# Patient Record
Sex: Female | Born: 1935 | Race: White | Hispanic: No | Smoking: Never smoker
Health system: Southern US, Community
[De-identification: ages and names within clinical notes are randomized; demographics above are authoritative.]

## PROBLEM LIST (undated history)

## (undated) DIAGNOSIS — I10 Essential (primary) hypertension: Secondary | ICD-10-CM

## (undated) DIAGNOSIS — E119 Type 2 diabetes mellitus without complications: Secondary | ICD-10-CM

## (undated) DIAGNOSIS — F0390 Unspecified dementia without behavioral disturbance: Secondary | ICD-10-CM

## (undated) DIAGNOSIS — Z8572 Personal history of non-Hodgkin lymphomas: Secondary | ICD-10-CM

## (undated) DIAGNOSIS — R569 Unspecified convulsions: Secondary | ICD-10-CM

## (undated) HISTORY — PX: NECK SURGERY: SHX720

---

## 2004-05-28 ENCOUNTER — Inpatient Hospital Stay: Payer: Self-pay | Admitting: Internal Medicine

## 2004-06-11 ENCOUNTER — Ambulatory Visit: Payer: Self-pay | Admitting: Oncology

## 2004-06-17 ENCOUNTER — Ambulatory Visit: Payer: Self-pay | Admitting: Oncology

## 2004-07-02 ENCOUNTER — Ambulatory Visit: Payer: Self-pay | Admitting: Oncology

## 2004-07-06 ENCOUNTER — Ambulatory Visit: Payer: Self-pay | Admitting: Oncology

## 2004-08-10 ENCOUNTER — Ambulatory Visit: Payer: Self-pay | Admitting: Internal Medicine

## 2004-10-05 ENCOUNTER — Ambulatory Visit: Payer: Self-pay | Admitting: Oncology

## 2004-10-28 ENCOUNTER — Ambulatory Visit: Payer: Self-pay | Admitting: Oncology

## 2005-02-08 ENCOUNTER — Ambulatory Visit: Payer: Self-pay | Admitting: Oncology

## 2005-02-27 ENCOUNTER — Ambulatory Visit: Payer: Self-pay | Admitting: Oncology

## 2005-03-30 ENCOUNTER — Ambulatory Visit: Payer: Self-pay | Admitting: Oncology

## 2005-05-26 ENCOUNTER — Ambulatory Visit: Payer: Self-pay | Admitting: Oncology

## 2005-06-08 ENCOUNTER — Ambulatory Visit: Payer: Self-pay | Admitting: Oncology

## 2005-06-24 ENCOUNTER — Ambulatory Visit: Payer: Self-pay | Admitting: Oncology

## 2005-06-30 ENCOUNTER — Ambulatory Visit: Payer: Self-pay | Admitting: Oncology

## 2005-09-13 ENCOUNTER — Ambulatory Visit: Payer: Self-pay | Admitting: Internal Medicine

## 2005-09-15 ENCOUNTER — Ambulatory Visit: Payer: Self-pay | Admitting: Internal Medicine

## 2005-11-02 ENCOUNTER — Ambulatory Visit: Payer: Self-pay | Admitting: Oncology

## 2005-11-27 ENCOUNTER — Ambulatory Visit: Payer: Self-pay | Admitting: Oncology

## 2006-03-10 ENCOUNTER — Ambulatory Visit: Payer: Self-pay | Admitting: Internal Medicine

## 2006-03-15 ENCOUNTER — Ambulatory Visit: Payer: Self-pay | Admitting: Internal Medicine

## 2006-05-01 ENCOUNTER — Ambulatory Visit: Payer: Self-pay | Admitting: Oncology

## 2006-05-30 ENCOUNTER — Ambulatory Visit: Payer: Self-pay | Admitting: Oncology

## 2006-09-28 ENCOUNTER — Ambulatory Visit: Payer: Self-pay | Admitting: Internal Medicine

## 2006-10-29 ENCOUNTER — Ambulatory Visit: Payer: Self-pay | Admitting: Oncology

## 2006-11-01 ENCOUNTER — Ambulatory Visit: Payer: Self-pay | Admitting: Oncology

## 2006-11-28 ENCOUNTER — Ambulatory Visit: Payer: Self-pay | Admitting: Oncology

## 2007-04-12 ENCOUNTER — Ambulatory Visit: Payer: Self-pay | Admitting: Internal Medicine

## 2007-05-02 ENCOUNTER — Ambulatory Visit: Payer: Self-pay | Admitting: Oncology

## 2007-05-31 ENCOUNTER — Ambulatory Visit: Payer: Self-pay | Admitting: Oncology

## 2007-09-13 ENCOUNTER — Emergency Department: Payer: Self-pay | Admitting: Emergency Medicine

## 2007-10-02 ENCOUNTER — Ambulatory Visit: Payer: Self-pay | Admitting: Internal Medicine

## 2007-10-29 ENCOUNTER — Ambulatory Visit: Payer: Self-pay | Admitting: Oncology

## 2007-11-28 ENCOUNTER — Ambulatory Visit: Payer: Self-pay | Admitting: Oncology

## 2008-04-15 ENCOUNTER — Ambulatory Visit: Payer: Self-pay | Admitting: Internal Medicine

## 2008-10-06 ENCOUNTER — Ambulatory Visit: Payer: Self-pay | Admitting: Internal Medicine

## 2008-10-28 ENCOUNTER — Ambulatory Visit: Payer: Self-pay | Admitting: Oncology

## 2008-10-30 ENCOUNTER — Ambulatory Visit: Payer: Self-pay | Admitting: Oncology

## 2008-11-27 ENCOUNTER — Ambulatory Visit: Payer: Self-pay | Admitting: Oncology

## 2009-10-08 ENCOUNTER — Ambulatory Visit: Payer: Self-pay | Admitting: Internal Medicine

## 2009-10-28 ENCOUNTER — Ambulatory Visit: Payer: Self-pay | Admitting: Oncology

## 2009-11-04 ENCOUNTER — Ambulatory Visit: Payer: Self-pay | Admitting: Oncology

## 2009-11-27 ENCOUNTER — Ambulatory Visit: Payer: Self-pay | Admitting: Oncology

## 2010-02-27 ENCOUNTER — Ambulatory Visit: Payer: Self-pay | Admitting: Oncology

## 2010-03-08 ENCOUNTER — Ambulatory Visit: Payer: Self-pay | Admitting: Oncology

## 2010-03-10 LAB — CEA: CEA: 3.3 ng/mL (ref 0.0–4.7)

## 2010-03-30 ENCOUNTER — Ambulatory Visit: Payer: Self-pay | Admitting: Oncology

## 2010-09-27 ENCOUNTER — Ambulatory Visit: Payer: Self-pay | Admitting: Oncology

## 2010-09-28 ENCOUNTER — Ambulatory Visit: Payer: Self-pay | Admitting: Oncology

## 2010-10-12 ENCOUNTER — Ambulatory Visit: Payer: Self-pay | Admitting: Internal Medicine

## 2011-03-28 ENCOUNTER — Ambulatory Visit: Payer: Self-pay | Admitting: Oncology

## 2011-03-31 ENCOUNTER — Ambulatory Visit: Payer: Self-pay | Admitting: Oncology

## 2011-12-12 ENCOUNTER — Ambulatory Visit: Payer: Self-pay | Admitting: Internal Medicine

## 2012-02-20 ENCOUNTER — Ambulatory Visit: Payer: Self-pay | Admitting: Oncology

## 2012-02-20 LAB — COMPREHENSIVE METABOLIC PANEL
Alkaline Phosphatase: 87 U/L (ref 50–136)
Anion Gap: 6 — ABNORMAL LOW (ref 7–16)
BUN: 16 mg/dL (ref 7–18)
Bilirubin,Total: 0.5 mg/dL (ref 0.2–1.0)
Chloride: 104 mmol/L (ref 98–107)
Co2: 31 mmol/L (ref 21–32)
Creatinine: 0.71 mg/dL (ref 0.60–1.30)
EGFR (African American): >60
EGFR (Non-African Amer.): >60
Glucose: 140 mg/dL — ABNORMAL HIGH (ref 65–99)
Osmolality: 285 (ref 275–301)
Potassium: 4.3 mmol/L (ref 3.5–5.1)
SGPT (ALT): 19 U/L (ref 12–78)
Total Protein: 7.5 g/dL (ref 6.4–8.2)

## 2012-02-20 LAB — CBC CANCER CENTER
Basophil #: 0 x10 3/mm (ref 0.0–0.1)
Basophil %: 0.7 %
Eosinophil #: 0.3 x10 3/mm (ref 0.0–0.7)
HCT: 41.9 % (ref 35.0–47.0)
HGB: 13.7 g/dL (ref 12.0–16.0)
Lymphocyte %: 34.4 %
MCH: 30.9 pg (ref 26.0–34.0)
MCHC: 32.7 g/dL (ref 32.0–36.0)
Monocyte #: 0.5 x10 3/mm (ref 0.2–0.9)
Neutrophil #: 2.9 x10 3/mm (ref 1.4–6.5)
Platelet: 175 x10 3/mm (ref 150–440)
RDW: 13.3 % (ref 11.5–14.5)

## 2012-02-20 LAB — LACTATE DEHYDROGENASE: LDH: 135 U/L (ref 81–246)

## 2012-02-28 ENCOUNTER — Ambulatory Visit: Payer: Self-pay | Admitting: Oncology

## 2012-12-12 ENCOUNTER — Ambulatory Visit: Payer: Self-pay | Admitting: Internal Medicine

## 2014-03-12 ENCOUNTER — Ambulatory Visit: Payer: Self-pay | Admitting: Internal Medicine

## 2014-06-18 DIAGNOSIS — L821 Other seborrheic keratosis: Secondary | ICD-10-CM | POA: Diagnosis not present

## 2014-06-18 DIAGNOSIS — L219 Seborrheic dermatitis, unspecified: Secondary | ICD-10-CM | POA: Diagnosis not present

## 2014-06-18 DIAGNOSIS — L814 Other melanin hyperpigmentation: Secondary | ICD-10-CM | POA: Diagnosis not present

## 2014-06-18 DIAGNOSIS — L820 Inflamed seborrheic keratosis: Secondary | ICD-10-CM | POA: Diagnosis not present

## 2014-06-18 DIAGNOSIS — L578 Other skin changes due to chronic exposure to nonionizing radiation: Secondary | ICD-10-CM | POA: Diagnosis not present

## 2014-06-18 DIAGNOSIS — L570 Actinic keratosis: Secondary | ICD-10-CM | POA: Diagnosis not present

## 2014-06-27 DIAGNOSIS — E119 Type 2 diabetes mellitus without complications: Secondary | ICD-10-CM | POA: Diagnosis not present

## 2014-06-27 DIAGNOSIS — I10 Essential (primary) hypertension: Secondary | ICD-10-CM | POA: Diagnosis not present

## 2014-06-27 DIAGNOSIS — F0150 Vascular dementia without behavioral disturbance: Secondary | ICD-10-CM | POA: Diagnosis not present

## 2014-06-27 DIAGNOSIS — R7989 Other specified abnormal findings of blood chemistry: Secondary | ICD-10-CM | POA: Diagnosis not present

## 2014-07-04 DIAGNOSIS — I10 Essential (primary) hypertension: Secondary | ICD-10-CM | POA: Diagnosis not present

## 2014-07-04 DIAGNOSIS — E119 Type 2 diabetes mellitus without complications: Secondary | ICD-10-CM | POA: Diagnosis not present

## 2014-07-04 DIAGNOSIS — R7989 Other specified abnormal findings of blood chemistry: Secondary | ICD-10-CM | POA: Diagnosis not present

## 2014-11-20 DIAGNOSIS — M25562 Pain in left knee: Secondary | ICD-10-CM | POA: Diagnosis not present

## 2014-11-20 DIAGNOSIS — M1712 Unilateral primary osteoarthritis, left knee: Secondary | ICD-10-CM | POA: Diagnosis not present

## 2014-12-03 DIAGNOSIS — E119 Type 2 diabetes mellitus without complications: Secondary | ICD-10-CM | POA: Diagnosis not present

## 2014-12-03 DIAGNOSIS — R7989 Other specified abnormal findings of blood chemistry: Secondary | ICD-10-CM | POA: Diagnosis not present

## 2014-12-03 DIAGNOSIS — I10 Essential (primary) hypertension: Secondary | ICD-10-CM | POA: Diagnosis not present

## 2014-12-10 DIAGNOSIS — I10 Essential (primary) hypertension: Secondary | ICD-10-CM | POA: Diagnosis not present

## 2014-12-10 DIAGNOSIS — R7989 Other specified abnormal findings of blood chemistry: Secondary | ICD-10-CM | POA: Diagnosis not present

## 2014-12-10 DIAGNOSIS — M25562 Pain in left knee: Secondary | ICD-10-CM | POA: Diagnosis not present

## 2014-12-10 DIAGNOSIS — E119 Type 2 diabetes mellitus without complications: Secondary | ICD-10-CM | POA: Diagnosis not present

## 2014-12-12 DIAGNOSIS — M25562 Pain in left knee: Secondary | ICD-10-CM | POA: Diagnosis not present

## 2014-12-12 DIAGNOSIS — M1712 Unilateral primary osteoarthritis, left knee: Secondary | ICD-10-CM | POA: Diagnosis not present

## 2015-04-10 DIAGNOSIS — E119 Type 2 diabetes mellitus without complications: Secondary | ICD-10-CM | POA: Diagnosis not present

## 2015-04-10 DIAGNOSIS — R7989 Other specified abnormal findings of blood chemistry: Secondary | ICD-10-CM | POA: Diagnosis not present

## 2015-04-10 DIAGNOSIS — M25562 Pain in left knee: Secondary | ICD-10-CM | POA: Diagnosis not present

## 2015-04-10 DIAGNOSIS — I10 Essential (primary) hypertension: Secondary | ICD-10-CM | POA: Diagnosis not present

## 2015-04-15 DIAGNOSIS — L84 Corns and callosities: Secondary | ICD-10-CM | POA: Diagnosis not present

## 2015-04-15 DIAGNOSIS — F0150 Vascular dementia without behavioral disturbance: Secondary | ICD-10-CM | POA: Diagnosis not present

## 2015-04-15 DIAGNOSIS — R634 Abnormal weight loss: Secondary | ICD-10-CM | POA: Diagnosis not present

## 2015-04-15 DIAGNOSIS — R140 Abdominal distension (gaseous): Secondary | ICD-10-CM | POA: Diagnosis not present

## 2015-04-15 DIAGNOSIS — Z23 Encounter for immunization: Secondary | ICD-10-CM | POA: Diagnosis not present

## 2015-04-15 DIAGNOSIS — E119 Type 2 diabetes mellitus without complications: Secondary | ICD-10-CM | POA: Diagnosis not present

## 2015-04-15 DIAGNOSIS — I10 Essential (primary) hypertension: Secondary | ICD-10-CM | POA: Diagnosis not present

## 2015-04-15 DIAGNOSIS — R7989 Other specified abnormal findings of blood chemistry: Secondary | ICD-10-CM | POA: Diagnosis not present

## 2015-04-21 ENCOUNTER — Other Ambulatory Visit: Payer: Self-pay | Admitting: Internal Medicine

## 2015-04-21 DIAGNOSIS — R634 Abnormal weight loss: Secondary | ICD-10-CM

## 2015-04-21 DIAGNOSIS — R140 Abdominal distension (gaseous): Secondary | ICD-10-CM

## 2015-05-04 DIAGNOSIS — M898X9 Other specified disorders of bone, unspecified site: Secondary | ICD-10-CM | POA: Diagnosis not present

## 2015-05-04 DIAGNOSIS — B351 Tinea unguium: Secondary | ICD-10-CM | POA: Diagnosis not present

## 2015-05-04 DIAGNOSIS — E1142 Type 2 diabetes mellitus with diabetic polyneuropathy: Secondary | ICD-10-CM | POA: Diagnosis not present

## 2015-05-04 DIAGNOSIS — L851 Acquired keratosis [keratoderma] palmaris et plantaris: Secondary | ICD-10-CM | POA: Diagnosis not present

## 2015-05-07 ENCOUNTER — Ambulatory Visit
Admission: RE | Admit: 2015-05-07 | Discharge: 2015-05-07 | Disposition: A | Discharge status: Home / Self Care | Payer: Commercial Managed Care - HMO | Source: Ambulatory Visit | Attending: Internal Medicine | Admitting: Internal Medicine

## 2015-05-07 DIAGNOSIS — R634 Abnormal weight loss: Secondary | ICD-10-CM | POA: Diagnosis not present

## 2015-05-07 DIAGNOSIS — R140 Abdominal distension (gaseous): Secondary | ICD-10-CM | POA: Insufficient documentation

## 2015-05-07 HISTORY — DX: Type 2 diabetes mellitus without complications: E11.9

## 2015-05-07 MED ORDER — IOHEXOL 300 MG/ML  SOLN
100.0000 mL | Freq: Once | INTRAMUSCULAR | Status: AC | PRN
  Administered 2015-05-07: 100 mL via INTRAVENOUS

## 2015-05-29 DIAGNOSIS — E119 Type 2 diabetes mellitus without complications: Secondary | ICD-10-CM | POA: Diagnosis not present

## 2015-08-14 ENCOUNTER — Other Ambulatory Visit: Payer: Self-pay | Admitting: Internal Medicine

## 2015-08-14 DIAGNOSIS — Z8572 Personal history of non-Hodgkin lymphomas: Secondary | ICD-10-CM | POA: Diagnosis not present

## 2015-08-14 DIAGNOSIS — L989 Disorder of the skin and subcutaneous tissue, unspecified: Secondary | ICD-10-CM | POA: Diagnosis not present

## 2015-08-14 DIAGNOSIS — F0150 Vascular dementia without behavioral disturbance: Secondary | ICD-10-CM | POA: Diagnosis not present

## 2015-08-14 DIAGNOSIS — I10 Essential (primary) hypertension: Secondary | ICD-10-CM | POA: Diagnosis not present

## 2015-08-14 DIAGNOSIS — Z1231 Encounter for screening mammogram for malignant neoplasm of breast: Secondary | ICD-10-CM

## 2015-08-14 DIAGNOSIS — E119 Type 2 diabetes mellitus without complications: Secondary | ICD-10-CM | POA: Diagnosis not present

## 2015-08-14 DIAGNOSIS — Z1239 Encounter for other screening for malignant neoplasm of breast: Secondary | ICD-10-CM | POA: Diagnosis not present

## 2015-08-24 ENCOUNTER — Ambulatory Visit: Payer: Commercial Managed Care - HMO

## 2015-12-23 DIAGNOSIS — Z1239 Encounter for other screening for malignant neoplasm of breast: Secondary | ICD-10-CM | POA: Diagnosis not present

## 2015-12-23 DIAGNOSIS — Z8572 Personal history of non-Hodgkin lymphomas: Secondary | ICD-10-CM | POA: Diagnosis not present

## 2015-12-23 DIAGNOSIS — I10 Essential (primary) hypertension: Secondary | ICD-10-CM | POA: Diagnosis not present

## 2015-12-23 DIAGNOSIS — E119 Type 2 diabetes mellitus without complications: Secondary | ICD-10-CM | POA: Diagnosis not present

## 2015-12-23 DIAGNOSIS — F0150 Vascular dementia without behavioral disturbance: Secondary | ICD-10-CM | POA: Diagnosis not present

## 2015-12-30 DIAGNOSIS — E119 Type 2 diabetes mellitus without complications: Secondary | ICD-10-CM | POA: Diagnosis not present

## 2015-12-30 DIAGNOSIS — R7989 Other specified abnormal findings of blood chemistry: Secondary | ICD-10-CM | POA: Diagnosis not present

## 2015-12-30 DIAGNOSIS — Z0000 Encounter for general adult medical examination without abnormal findings: Secondary | ICD-10-CM | POA: Diagnosis not present

## 2015-12-30 DIAGNOSIS — F0150 Vascular dementia without behavioral disturbance: Secondary | ICD-10-CM | POA: Diagnosis not present

## 2015-12-30 DIAGNOSIS — I10 Essential (primary) hypertension: Secondary | ICD-10-CM | POA: Diagnosis not present

## 2015-12-30 DIAGNOSIS — Z23 Encounter for immunization: Secondary | ICD-10-CM | POA: Diagnosis not present

## 2015-12-31 DIAGNOSIS — R7989 Other specified abnormal findings of blood chemistry: Secondary | ICD-10-CM | POA: Diagnosis not present

## 2015-12-31 DIAGNOSIS — Z23 Encounter for immunization: Secondary | ICD-10-CM | POA: Diagnosis not present

## 2015-12-31 DIAGNOSIS — E119 Type 2 diabetes mellitus without complications: Secondary | ICD-10-CM | POA: Diagnosis not present

## 2015-12-31 DIAGNOSIS — F0150 Vascular dementia without behavioral disturbance: Secondary | ICD-10-CM | POA: Diagnosis not present

## 2015-12-31 DIAGNOSIS — Z0000 Encounter for general adult medical examination without abnormal findings: Secondary | ICD-10-CM | POA: Diagnosis not present

## 2015-12-31 DIAGNOSIS — I10 Essential (primary) hypertension: Secondary | ICD-10-CM | POA: Diagnosis not present

## 2016-03-15 DIAGNOSIS — E119 Type 2 diabetes mellitus without complications: Secondary | ICD-10-CM | POA: Diagnosis not present

## 2016-03-15 DIAGNOSIS — F0150 Vascular dementia without behavioral disturbance: Secondary | ICD-10-CM | POA: Diagnosis not present

## 2016-03-15 DIAGNOSIS — R8290 Unspecified abnormal findings in urine: Secondary | ICD-10-CM | POA: Diagnosis not present

## 2016-03-15 DIAGNOSIS — R062 Wheezing: Secondary | ICD-10-CM | POA: Diagnosis not present

## 2016-03-15 DIAGNOSIS — Z0000 Encounter for general adult medical examination without abnormal findings: Secondary | ICD-10-CM | POA: Diagnosis not present

## 2016-03-15 DIAGNOSIS — J209 Acute bronchitis, unspecified: Secondary | ICD-10-CM | POA: Diagnosis not present

## 2016-03-15 DIAGNOSIS — R05 Cough: Secondary | ICD-10-CM | POA: Diagnosis not present

## 2016-03-15 DIAGNOSIS — I10 Essential (primary) hypertension: Secondary | ICD-10-CM | POA: Diagnosis not present

## 2016-04-29 DIAGNOSIS — F0150 Vascular dementia without behavioral disturbance: Secondary | ICD-10-CM | POA: Diagnosis not present

## 2016-04-29 DIAGNOSIS — E119 Type 2 diabetes mellitus without complications: Secondary | ICD-10-CM | POA: Diagnosis not present

## 2016-04-29 DIAGNOSIS — Z23 Encounter for immunization: Secondary | ICD-10-CM | POA: Diagnosis not present

## 2016-04-29 DIAGNOSIS — Z0000 Encounter for general adult medical examination without abnormal findings: Secondary | ICD-10-CM | POA: Diagnosis not present

## 2016-04-29 DIAGNOSIS — I10 Essential (primary) hypertension: Secondary | ICD-10-CM | POA: Diagnosis not present

## 2016-04-29 DIAGNOSIS — R7989 Other specified abnormal findings of blood chemistry: Secondary | ICD-10-CM | POA: Diagnosis not present

## 2016-05-05 DIAGNOSIS — I10 Essential (primary) hypertension: Secondary | ICD-10-CM | POA: Diagnosis not present

## 2016-05-05 DIAGNOSIS — E119 Type 2 diabetes mellitus without complications: Secondary | ICD-10-CM | POA: Diagnosis not present

## 2016-05-05 DIAGNOSIS — F0150 Vascular dementia without behavioral disturbance: Secondary | ICD-10-CM | POA: Diagnosis not present

## 2016-05-06 ENCOUNTER — Other Ambulatory Visit: Payer: Self-pay | Admitting: Internal Medicine

## 2016-05-06 DIAGNOSIS — F0150 Vascular dementia without behavioral disturbance: Secondary | ICD-10-CM

## 2016-05-15 ENCOUNTER — Inpatient Hospital Stay
Admission: EM | Admit: 2016-05-15 | Discharge: 2016-05-17 | DRG: 066 | Payer: Commercial Managed Care - HMO | Attending: Internal Medicine | Admitting: Internal Medicine

## 2016-05-15 ENCOUNTER — Encounter: Payer: Self-pay | Admitting: Emergency Medicine

## 2016-05-15 ENCOUNTER — Emergency Department: Payer: Commercial Managed Care - HMO

## 2016-05-15 DIAGNOSIS — R4701 Aphasia: Secondary | ICD-10-CM | POA: Diagnosis not present

## 2016-05-15 DIAGNOSIS — E1151 Type 2 diabetes mellitus with diabetic peripheral angiopathy without gangrene: Secondary | ICD-10-CM | POA: Diagnosis present

## 2016-05-15 DIAGNOSIS — Z79899 Other long term (current) drug therapy: Secondary | ICD-10-CM | POA: Diagnosis not present

## 2016-05-15 DIAGNOSIS — I69320 Aphasia following cerebral infarction: Secondary | ICD-10-CM | POA: Diagnosis not present

## 2016-05-15 DIAGNOSIS — F0390 Unspecified dementia without behavioral disturbance: Secondary | ICD-10-CM | POA: Diagnosis present

## 2016-05-15 DIAGNOSIS — M6281 Muscle weakness (generalized): Secondary | ICD-10-CM | POA: Diagnosis not present

## 2016-05-15 DIAGNOSIS — Z8572 Personal history of non-Hodgkin lymphomas: Secondary | ICD-10-CM | POA: Diagnosis not present

## 2016-05-15 DIAGNOSIS — R4182 Altered mental status, unspecified: Secondary | ICD-10-CM | POA: Diagnosis not present

## 2016-05-15 DIAGNOSIS — Z66 Do not resuscitate: Secondary | ICD-10-CM | POA: Diagnosis not present

## 2016-05-15 DIAGNOSIS — Z833 Family history of diabetes mellitus: Secondary | ICD-10-CM | POA: Diagnosis not present

## 2016-05-15 DIAGNOSIS — E119 Type 2 diabetes mellitus without complications: Secondary | ICD-10-CM | POA: Diagnosis not present

## 2016-05-15 DIAGNOSIS — Z7984 Long term (current) use of oral hypoglycemic drugs: Secondary | ICD-10-CM | POA: Diagnosis not present

## 2016-05-15 DIAGNOSIS — R2689 Other abnormalities of gait and mobility: Secondary | ICD-10-CM | POA: Diagnosis not present

## 2016-05-15 DIAGNOSIS — E876 Hypokalemia: Secondary | ICD-10-CM | POA: Diagnosis present

## 2016-05-15 DIAGNOSIS — R29716 NIHSS score 16: Secondary | ICD-10-CM | POA: Diagnosis present

## 2016-05-15 DIAGNOSIS — R488 Other symbolic dysfunctions: Secondary | ICD-10-CM | POA: Diagnosis not present

## 2016-05-15 DIAGNOSIS — I10 Essential (primary) hypertension: Secondary | ICD-10-CM | POA: Diagnosis present

## 2016-05-15 DIAGNOSIS — Z888 Allergy status to other drugs, medicaments and biological substances status: Secondary | ICD-10-CM | POA: Diagnosis not present

## 2016-05-15 DIAGNOSIS — I639 Cerebral infarction, unspecified: Secondary | ICD-10-CM | POA: Diagnosis present

## 2016-05-15 DIAGNOSIS — I638 Other cerebral infarction: Principal | ICD-10-CM | POA: Diagnosis present

## 2016-05-15 DIAGNOSIS — R1312 Dysphagia, oropharyngeal phase: Secondary | ICD-10-CM | POA: Diagnosis not present

## 2016-05-15 DIAGNOSIS — R410 Disorientation, unspecified: Secondary | ICD-10-CM | POA: Diagnosis not present

## 2016-05-15 DIAGNOSIS — I63031 Cerebral infarction due to thrombosis of right carotid artery: Secondary | ICD-10-CM | POA: Diagnosis not present

## 2016-05-15 DIAGNOSIS — G459 Transient cerebral ischemic attack, unspecified: Secondary | ICD-10-CM | POA: Diagnosis not present

## 2016-05-15 DIAGNOSIS — F329 Major depressive disorder, single episode, unspecified: Secondary | ICD-10-CM | POA: Diagnosis not present

## 2016-05-15 DIAGNOSIS — I6381 Other cerebral infarction due to occlusion or stenosis of small artery: Secondary | ICD-10-CM

## 2016-05-15 HISTORY — DX: Personal history of non-Hodgkin lymphomas: Z85.72

## 2016-05-15 HISTORY — DX: Unspecified dementia, unspecified severity, without behavioral disturbance, psychotic disturbance, mood disturbance, and anxiety: F03.90

## 2016-05-15 HISTORY — DX: Essential (primary) hypertension: I10

## 2016-05-15 LAB — CBC WITH DIFFERENTIAL/PLATELET
BASOS ABS: 0.1 10*3/uL (ref 0–0.1)
BASOS PCT: 1 %
EOS PCT: 5 %
Eosinophils Absolute: 0.6 10*3/uL (ref 0–0.7)
HCT: 45.7 % (ref 35.0–47.0)
Hemoglobin: 15.3 g/dL (ref 12.0–16.0)
LYMPHS PCT: 30 %
Lymphs Abs: 3.4 10*3/uL (ref 1.0–3.6)
MCH: 30.8 pg (ref 26.0–34.0)
MCHC: 33.5 g/dL (ref 32.0–36.0)
MCV: 92.1 fL (ref 80.0–100.0)
MONO ABS: 0.8 10*3/uL (ref 0.2–0.9)
Monocytes Relative: 7 %
NEUTROS ABS: 6.7 10*3/uL — AB (ref 1.4–6.5)
Neutrophils Relative %: 57 %
PLATELETS: 272 10*3/uL (ref 150–440)
RBC: 4.96 MIL/uL (ref 3.80–5.20)
RDW: 13.8 % (ref 11.5–14.5)
WBC: 11.5 10*3/uL — AB (ref 3.6–11.0)

## 2016-05-15 LAB — URINALYSIS, ROUTINE W REFLEX MICROSCOPIC
Bilirubin Urine: NEGATIVE
Glucose, UA: 50 mg/dL — AB
Hgb urine dipstick: NEGATIVE
Ketones, ur: NEGATIVE mg/dL
LEUKOCYTES UA: NEGATIVE
NITRITE: NEGATIVE
PROTEIN: NEGATIVE mg/dL
Specific Gravity, Urine: 1.006 (ref 1.005–1.030)
pH: 8 (ref 5.0–8.0)

## 2016-05-15 LAB — COMPREHENSIVE METABOLIC PANEL
ALBUMIN: 4.5 g/dL (ref 3.5–5.0)
ALT: 19 U/L (ref 14–54)
AST: 28 U/L (ref 15–41)
Alkaline Phosphatase: 85 U/L (ref 38–126)
Anion gap: 10 (ref 5–15)
BUN: 17 mg/dL (ref 6–20)
CHLORIDE: 100 mmol/L — AB (ref 101–111)
CO2: 28 mmol/L (ref 22–32)
CREATININE: 0.66 mg/dL (ref 0.44–1.00)
Calcium: 10 mg/dL (ref 8.9–10.3)
GFR calc Af Amer: >60 mL/min (ref >60)
GFR calc non Af Amer: >60 mL/min (ref >60)
GLUCOSE: 175 mg/dL — AB (ref 65–99)
POTASSIUM: 3.1 mmol/L — AB (ref 3.5–5.1)
Sodium: 138 mmol/L (ref 135–145)
Total Bilirubin: 0.5 mg/dL (ref 0.3–1.2)
Total Protein: 8.6 g/dL — ABNORMAL HIGH (ref 6.5–8.1)

## 2016-05-15 LAB — URINE DRUG SCREEN, QUALITATIVE (ARMC ONLY)
Amphetamines, Ur Screen: NOT DETECTED
BARBITURATES, UR SCREEN: NOT DETECTED
BENZODIAZEPINE, UR SCRN: NOT DETECTED
Cannabinoid 50 Ng, Ur ~~LOC~~: NOT DETECTED
Cocaine Metabolite,Ur ~~LOC~~: NOT DETECTED
MDMA (Ecstasy)Ur Screen: NOT DETECTED
METHADONE SCREEN, URINE: NOT DETECTED
OPIATE, UR SCREEN: NOT DETECTED
Phencyclidine (PCP) Ur S: NOT DETECTED
TRICYCLIC, UR SCREEN: NOT DETECTED

## 2016-05-15 LAB — MAGNESIUM: Magnesium: 1.6 mg/dL — ABNORMAL LOW (ref 1.7–2.4)

## 2016-05-15 LAB — TROPONIN I
Troponin I: <0.03 ng/mL (ref <0.03)
Troponin I: <0.03 ng/mL (ref <0.03)

## 2016-05-15 LAB — LIPASE, BLOOD: Lipase: 18 U/L (ref 11–51)

## 2016-05-15 LAB — PROTIME-INR
INR: 0.95
PROTHROMBIN TIME: 12.7 s (ref 11.4–15.2)

## 2016-05-15 LAB — GLUCOSE, CAPILLARY
GLUCOSE-CAPILLARY: 168 mg/dL — AB (ref 65–99)
Glucose-Capillary: 190 mg/dL — ABNORMAL HIGH (ref 65–99)

## 2016-05-15 LAB — ETHANOL: Alcohol, Ethyl (B): <5 mg/dL (ref <5)

## 2016-05-15 MED ORDER — SODIUM CHLORIDE 0.9 % IV SOLN
INTRAVENOUS | Status: DC
  Administered 2016-05-15: 23:00:00 via INTRAVENOUS

## 2016-05-15 MED ORDER — INSULIN ASPART 100 UNIT/ML ~~LOC~~ SOLN: 0.0000 [IU] | SUBCUTANEOUS | Status: DC

## 2016-05-15 MED ORDER — ENOXAPARIN SODIUM 40 MG/0.4ML ~~LOC~~ SOLN
40.0000 mg | SUBCUTANEOUS | Status: DC
  Administered 2016-05-15 – 2016-05-16 (×2): 40 mg via SUBCUTANEOUS
  Filled 2016-05-15 (×2): qty 0.4

## 2016-05-15 MED ORDER — ACETAMINOPHEN 160 MG/5ML PO SOLN: 650.0000 mg | ORAL | Status: DC | PRN

## 2016-05-15 MED ORDER — ACETAMINOPHEN 325 MG PO TABS: 650.0000 mg | ORAL_TABLET | ORAL | Status: DC | PRN

## 2016-05-15 MED ORDER — SENNOSIDES-DOCUSATE SODIUM 8.6-50 MG PO TABS: 1.0000 | ORAL_TABLET | Freq: Every evening | ORAL | Status: DC | PRN

## 2016-05-15 MED ORDER — ASPIRIN 300 MG RE SUPP: 300.0000 mg | Freq: Every day | RECTAL | Status: DC

## 2016-05-15 MED ORDER — STROKE: EARLY STAGES OF RECOVERY BOOK
Freq: Once | Status: AC
  Administered 2016-05-15: 22:00:00

## 2016-05-15 MED ORDER — INSULIN ASPART 100 UNIT/ML ~~LOC~~ SOLN
0.0000 [IU] | SUBCUTANEOUS | Status: DC
  Administered 2016-05-15 – 2016-05-16 (×2): 3 [IU] via SUBCUTANEOUS
  Administered 2016-05-16 (×2): 2 [IU] via SUBCUTANEOUS
  Administered 2016-05-16: 5 [IU] via SUBCUTANEOUS
  Administered 2016-05-16: 18:00:00 3 [IU] via SUBCUTANEOUS
  Filled 2016-05-15: qty 2
  Filled 2016-05-15: qty 5
  Filled 2016-05-15: qty 3
  Filled 2016-05-15: qty 2
  Filled 2016-05-15 (×2): qty 3

## 2016-05-15 MED ORDER — ACETAMINOPHEN 650 MG RE SUPP: 650.0000 mg | RECTAL | Status: DC | PRN

## 2016-05-15 MED ORDER — ASPIRIN 325 MG PO TABS
325.0000 mg | ORAL_TABLET | Freq: Every day | ORAL | Status: DC
  Administered 2016-05-16: 09:00:00 325 mg via ORAL
  Filled 2016-05-15: qty 1

## 2016-05-15 NOTE — Progress Notes (Signed)
Family Meeting Note  Advance Directive:yes  Today a meeting took place with the Daugher / HCP. Patient is unable to participate due WG:NFAOZHto:Lacked capacity AMS   The following clinical team members were present during this meeting:MD  The following were discussed:Patient's diagnosis: , Patient's progosis: Unable to determine and Goals for treatment: Full Code - patient's daughter, healthcare proxy indicated that she would like to be DO NOT RESUSCITATE but she does not have paperwork with her. They would consider filling out a MOLST.  Additional follow-up to be provided: Consider palliative care -   Time spent during discussion:15 minutes  [employer] Amiel Mccaffrey, DO

## 2016-05-15 NOTE — ED Notes (Signed)
Pt. Transported to CT 

## 2016-05-15 NOTE — ED Provider Notes (Signed)
[facility] Emergency Department Provider Note  ____________________________________________   First MD Initiated Contact with Patient 05/15/16 [phone]     (approximate)  I have reviewed the triage vital signs and the nursing notes.   HISTORY  Chief Complaint Altered Mental Status  History is limited by the patient's acute altered mental status and very mild chronic dementia  HPI Rose Jones is a 82 y.o. female who is brought by family for evaluation of acute altered mental status.  They state that they first noted she was not acting like herself 2 days ago.  She had "an off day" 2 days ago where she was not sticking to her usual routine and just wanting to sleep.  She seemed generally weak and unable to get around as well as usual.  She was also very quiet yesterday and not interested in her usual activities and was not as communicative as usual.  She would eat and drink but only if told specifically what to do and how to do it; they stated that she would hold a glass of water in her hand and just stare at it until she was told to drink it and then she would do so.  Her status has gradually gotten worse until today when she is unable to ambulate without assistance and is altered and off of her baseline.  She will open her eyes to her name and occasionally smile and will nod or shake her head but is not talking more than a word or 2 at a time and does not seem to be aware of where she is or what is happening.  She will nod and shake her head to answer simple yes and no questions and denies having any chest pain, shortness of breath, nor abdominal pain.  The patient does have mild age-related dementia but typically is very independent and has issues mostly related to short-term memory loss.  Her daughter and daughter-in-law state that her current state is very far from her baseline and very concerning for them.   Past Medical History:  Diagnosis Date  . Dementia   .  Diabetes mellitus without complication (HCC)   . History of B-cell lymphoma   . Hypertension     Patient Active Problem List   Diagnosis Date Noted  . CVA (cerebral vascular accident) (HCC) 05/15/2016    History reviewed. No pertinent surgical history.  Prior to Admission medications   Medication Sig Start Date End Date Taking? Authorizing Provider  amLODipine (NORVASC) 5 MG tablet Take 1 tablet by mouth daily. 03/16/16  Yes Historical Provider, MD  Calcium Carbonate-Vitamin D (CALTRATE 600+D PO) Take 1 tablet by mouth daily. 03/16/16  Yes Historical Provider, MD  glipiZIDE (GLUCOTROL) 10 MG tablet Take 1 tablet by mouth daily. 5mg  at night and 10mg  in the morning 05/06/16  Yes Historical Provider, MD  metFORMIN (GLUCOPHAGE) 500 MG tablet Take 1 tablet by mouth 2 (two) times daily. 03/16/16  Yes Historical Provider, MD  sertraline (ZOLOFT) 25 MG tablet Take 1 tablet by mouth daily. 03/16/16  Yes Historical Provider, MD  vitamin B-12 (CYANOCOBALAMIN) 1000 MCG tablet Take 1 tablet by mouth daily.   Yes Historical Provider, MD  Vitamin D, Ergocalciferol, (DRISDOL) 50000 units CAPS capsule Take 1 capsule by mouth once a week. 04/15/15  Yes Historical Provider, MD    Allergies Estrogens  Family History  Problem Relation Age of Onset  . Diabetes Father     Died at 67  . Diabetes Sister  Social History Social History  Substance Use Topics  . Smoking status: Never Smoker  . Smokeless tobacco: Never Used  . Alcohol use No    Review of Systems  Level 5 caveat: Unable to obtain due to the patient's acute altered mental status  ____________________________________________   PHYSICAL EXAM:  VITAL SIGNS: ED Triage Vitals [05/15/16 1543]  Enc Vitals Group     BP (!) 146/67     Pulse Rate 70     Resp 18     Temp 98 F (36.7 C)     Temp Source Oral     SpO2 95 %     Weight 144 lb (65.3 kg)     Height 5\' 2"  (1.575 m)     Head Circumference      Peak Flow      Pain  Score      Pain Loc      Pain Edu?      Excl. in GC?     Constitutional: Somnolent but opens eyes to light voice.  No acute distress.  Altered/encephalopathic Eyes: Conjunctivae are normal. PERRL. EOMI. Head: Atraumatic. Nose: No congestion/rhinnorhea. Mouth/Throat: Mucous membranes are moist.  Oropharynx non-erythematous. Neck: No stridor.  No meningeal signs.  No cervical spine tenderness to palpation. Cardiovascular: Normal rate, regular rhythm. Good peripheral circulation. Grossly normal heart sounds. Respiratory: Normal respiratory effort.  No retractions. Lungs CTAB. Gastrointestinal: Soft and nontender. No distention.  Musculoskeletal: No lower extremity tenderness nor edema. No gross deformities of extremities. Neurologic:  The patient cannot dissipate and a neurological exam but she is moving her extremities.  He has decreased grip strength bilaterally, slightly less on the right, but this may be due to effort. Skin:  Skin is warm, dry and intact. No rash noted.  ____________________________________________   LABS (all labs ordered are listed, but only abnormal results are displayed)  Labs Reviewed  CBC WITH DIFFERENTIAL/PLATELET - Abnormal; Notable for the following:       Result Value   WBC 11.5 (*)    Neutro Abs 6.7 (*)    All other components within normal limits  COMPREHENSIVE METABOLIC PANEL - Abnormal; Notable for the following:    Potassium 3.1 (*)    Chloride 100 (*)    Glucose, Bld 175 (*)    Total Protein 8.6 (*)    All other components within normal limits  MAGNESIUM - Abnormal; Notable for the following:    Magnesium 1.6 (*)    All other components within normal limits  URINALYSIS, ROUTINE W REFLEX MICROSCOPIC - Abnormal; Notable for the following:    Color, Urine STRAW (*)    APPearance CLEAR (*)    Glucose, UA 50 (*)    All other components within normal limits  LIPASE, BLOOD  TROPONIN I  ETHANOL  PROTIME-INR  URINE DRUG SCREEN, QUALITATIVE  ([facility] ONLY)  GLUCOSE, CAPILLARY   ____________________________________________  EKG  ED ECG REPORT I, Tiajuana Leppanen, the attending physician, personally viewed and interpreted this ECG.  Date: 05/15/2016 EKG Time: 16:21 Rate: 65 Rhythm: normal sinus rhythm QRS Axis: normal Intervals: normal ST/T Wave abnormalities: normal Conduction Disturbances: none Narrative Interpretation: unremarkable  ____________________________________________  RADIOLOGY   Ct Head Wo Contrast  Result Date: 05/15/2016 CLINICAL DATA:  Per family pt woke up this am not acting right. Pt normally very active around the house, today pt had confusion and is not her normal self. Pt with hx of seizures 30+ years ago and hx of B-cell ca. EXAM: CT  HEAD WITHOUT CONTRAST TECHNIQUE: Contiguous axial images were obtained from the base of the skull through the vertex without intravenous contrast. COMPARISON:  MRI of the brain on 03/10/2006 FINDINGS: Brain: There is focal low-attenuation within the left caudate nucleus, internal capsule, globus pallidus, and putamen, consistent with probable subacute or chronic infarct. No associated hemorrhage. The findings are new since the prior MRI. There is moderate atrophy. Significant periventricular white matter changes are consistent with small vessel disease. There is no intra or extra-axial fluid collection or mass lesion. The basilar cisterns and ventricles have a normal appearance. There is no CT evidence for acute infarction or hemorrhage. Vascular: There is atherosclerotic calcification of the carotid siphons. Skull: Normal. Negative for fracture or focal lesion. Sinuses/Orbits: There is moderate mucoperiosteal thickening involving the ethmoid and sphenoid air cells. Orbits are unremarkable in appearance. Mastoid air cells are normally aerated. Other: None IMPRESSION: 1. Interval infarct of the left basal ganglia not associated with hemorrhage. The appearance favors a subacute or  chronic process. 2. Atrophy and small vessel disease. 3. Moderate paranasal sinus disease. Critical Value/emergent results were called by telephone at the time of interpretation on 05/15/2016 at 5:49 pm to Dr. Roxan Hockeyobinson, who verbally acknowledged these results. Electronically Signed   By: Norva PavlovElizabeth  Brown M.D.   On: 05/15/2016 17:50    ____________________________________________   PROCEDURES  Procedure(s) performed:   .Critical Care Performed by: Loleta RoseFORBACH, Yomira Flitton Authorized by: Loleta RoseFORBACH, Jennamarie Goings   Critical care provider statement:    Critical care time (minutes):  30   Critical care time was exclusive of:  Separately billable procedures and treating other patients   Critical care was necessary to treat or prevent imminent or life-threatening deterioration of the following conditions:  CNS failure or compromise   Critical care was time spent personally by me on the following activities:  Development of treatment plan with patient or surrogate, discussions with consultants, evaluation of patient's response to treatment, examination of patient, obtaining history from patient or surrogate, ordering and performing treatments and interventions, ordering and review of laboratory studies, ordering and review of radiographic studies, pulse oximetry, re-evaluation of patient's condition and review of old charts      Critical Care performed: Yes ____________________________________________   INITIAL IMPRESSION / ASSESSMENT AND PLAN / ED COURSE  Pertinent labs & imaging results that were available during my care of the patient were reviewed by me and considered in my medical decision making (see chart for details).  Patient appears acutely encephalopathic but is unclear why.  CVA is my most likely diagnosis although she has no specific or logical deficits other than her altered mental status and possible aphasia.  Broad evaluation including CT head non-contrast, I&O cath for urine, etc.  Discussed plan  with family.   Clinical Course as of May 16 2135  Wynelle LinkSun May 15, 2016  1805 CT scan consistent with subacute infarction, likely occurring 2 days ago when she first was acting abnormal.  She is well outside the TPA window.  I will consult the hospitalist for admission.  I discussed with the patient and her daughter and daughter-in-law the results.  The patient continues to have expressive aphasia but is nodding appropriately and seems to understand what I am saying.  [CF]    Clinical Course User Index [CF] Loleta Roseory Saanvi Hakala, MD   NIH Stroke Scale  Interval: Baseline Time: 16:10 Person Administering Scale: Woodley Petzold  Administer stroke scale items in the order listed. Record performance in each category after each subscale exam.  Do not go back and change scores. Follow directions provided for each exam technique. Scores should reflect what the patient does, not what the clinician thinks the patient can do. The clinician should record answers while administering the exam and work quickly. Except where indicated, the patient should not be coached (i.e., repeated requests to patient to make a special effort).   1a  Level of consciousness: 1=not alert but arousable by minor stimulation to obey, answer or respond  1b. LOC questions:  2=Performs neither task correctly  1c. LOC commands: 2=Performs neither task correctly  2.  Best Gaze: 0=normal  3.  Visual: 0=No visual loss  4. Facial Palsy: 0=Normal symmetric movement  5a.  Motor left arm: 2=Some effort against gravity, limb cannot get to or maintain (if cured) 90 (or 45) degrees, drifts down to bed, but has some effort against gravity  5b.  Motor right arm: 2=Some effort against gravity, limb cannot get to or maintain (if cured) 90 (or 45) degrees, drifts down to bed, but has some effort against gravity  6a. motor left leg: 2=Some effort against gravity, limb cannot get to or maintain (if cured) 90 (or 45) degrees, drifts down to bed, but has some  effort against gravity  6b  Motor right leg:  2=Some effort against gravity, limb cannot get to or maintain (if cured) 90 (or 45) degrees, drifts down to bed, but has some effort against gravity  7. Limb Ataxia: 0=Absent  8.  Sensory: 0=Normal; no sensory loss  9. Best Language:  3=Mute, global aphasia; no usable speech or auditory comprehension  10. Dysarthria: 0=Normal  11. Extinction and Inattention: 0=No abnormality  12. Distal motor function: 0=Normal   Total:   16   Although the patient's NIHSS is 16, it is largely due to her inability to cooperate with the exam.  The substantial aphasia is the most concerning symptom.  She is NOT a candidate for tPA given the onset of symptoms two days ago.  Holding on ASA because patient has not yet had RN swallow screen - deferring to hospitalist. ____________________________________________  FINAL CLINICAL IMPRESSION(S) / ED DIAGNOSES  Final diagnoses:  Basal ganglia infarction (HCC)  Expressive aphasia     MEDICATIONS GIVEN DURING THIS VISIT:  Medications - No data to display   NEW OUTPATIENT MEDICATIONS STARTED DURING THIS VISIT:  Current Discharge Medication List      Current Discharge Medication List      Current Discharge Medication List       Note:  This document was prepared using Dragon voice recognition software and may include unintentional dictation errors.    Loleta Rose, MD 05/15/16 2136

## 2016-05-15 NOTE — ED Triage Notes (Signed)
Woke up Friday morning per family and just wasn't acting herself.  Friday night got worse and has not really been talking since Friday evening.  Has always had some urinary incontinence but now having incontinence of stool as well and not taking care of self. Appears may have mild left facial droop but difficult to assess because pt not smiling much in triage. Pt asked where she is and states "in the middle of no where I guess".  Showed pen and pt states "yes" when asked if she knows what it is but cannot name it.

## 2016-05-15 NOTE — H&P (Signed)
[facility] - [place] @ [facility] Admission History and Physical [employer], D.O.    Patient Name: Rose Jones MR#: [id number] Date of Birth: [birth year] Date of Admission: 05/15/2016  Referring MD/NP/PA: Dr. York Cerise Primary Care Physician: Barbette Reichmann, MD Outpatient Specialists: none  Patient coming from: Home  Chief Complaint: Altered mental status  Please note the entire history is obtained from the patient's family and the emergency department staff and documentation. Patient's history is unreliable.  HPI: Rose Jones is a 82 y.o. female with a known history of diabetes, hypertension, dementia, B-cell lymphoma presents to the emergency department for evaluation of status.  Patient was in a usual state of health until 2 days ago when she experienced a sudden onset of mental status change. Patient's family stated that she was less interactive, very tired and not acting like herself after a long day out with family. Last time she was seen in her usual state of health was 12:15 around 7 PM. Since then she has been less communicative, not interested in her regular activities and sleeping more than usual. Patient's daughter and daughter-in-law indicates that she will occasionally respond to them with single word answers or shakes her head yes and no but most of the time she just looks at them with a blank stare. Today she was unable to ambulate without assistance which is far from her baseline where she is functionally independent.  Patient's daughter indicated that she has a brain MRI scheduled through her primary care provider because of a worsening of her dementia over the past 3 months.   Otherwise there has been no change in status. Patient has been taking medication as prescribed and there has been no recent change in medication or diet.  There has been no recent illness, travel or sick contacts.     Review of Systems:  Unable to obtain secondary to patient's altered  mental status   Past Medical History:  Diagnosis Date  . Dementia   . Diabetes mellitus without complication (HCC)   . History of B-cell lymphoma   . Hypertension     Surgical history significant for lymph node excision in the right neck. Family denies alcohol, tobacco or drug use. Patient lives with her 3 children rotating between their homes each month.  Allergies  Allergen Reactions  . Estrogens Other (See Comments)    Unknown     Family History  Problem Relation Age of Onset  . Diabetes Father     Died at 56  . Diabetes Sister    Family history has been reviewed and confirmed with patient.   Prior to Admission medications   Medication Sig Start Date End Date Taking? Authorizing Provider  amLODipine (NORVASC) 5 MG tablet Take 1 tablet by mouth daily. 03/16/16  Yes Historical Provider, MD  Calcium Carbonate-Vitamin D (CALTRATE 600+D PO) Take 1 tablet by mouth daily. 03/16/16  Yes Historical Provider, MD  glipiZIDE (GLUCOTROL) 10 MG tablet Take 1 tablet by mouth daily. 5mg  at night and 10mg  in the morning 05/06/16  Yes Historical Provider, MD  metFORMIN (GLUCOPHAGE) 500 MG tablet Take 1 tablet by mouth 2 (two) times daily. 03/16/16  Yes Historical Provider, MD  sertraline (ZOLOFT) 25 MG tablet Take 1 tablet by mouth daily. 03/16/16  Yes Historical Provider, MD  vitamin B-12 (CYANOCOBALAMIN) 1000 MCG tablet Take 1 tablet by mouth daily.   Yes Historical Provider, MD  Vitamin D, Ergocalciferol, (DRISDOL) 50000 units CAPS capsule Take 1 capsule by mouth once a week. 04/15/15  Yes Historical Provider, MD    Physical Exam: Vitals:   05/15/16 1700 05/15/16 1745 05/15/16 1800 05/15/16 1905  BP: (!) 115/98 (!) 178/69 (!) 190/82   Pulse: 65 (!) 58 71   Resp: 19 13 19    Temp:    98 F (36.7 C)  TempSrc:      SpO2: 96% 96% 96%   Weight:      Height:        GENERAL: 82 y.o.-year-old White female patient, well-developed, well-nourished lying in the bed in no acute distress.   Pleasant and cooperative.  Patient responds to some commands. HEENT: Head atraumatic, normocephalic. Pupils equal, round, reactive to light and accommodation. No scleral icterus. Extraocular muscles intact. Nares are patent. Oropharynx is clear. Mucus membranes moist. NECK: Supple, full range of motion. No JVD, no bruit heard. No thyroid enlargement, no tenderness, no cervical lymphadenopathy. CHEST: Normal breath sounds bilaterally. No wheezing, rales, rhonchi or crackles. No use of accessory muscles of respiration.  No reproducible chest wall tenderness.  CARDIOVASCULAR: S1, S2 normal. No murmurs, rubs, or gallops. Cap refill <2 seconds. Pulses intact distally.  ABDOMEN: Soft, nondistended, nontender, . No rebound, guarding, rigidity. Normoactive bowel sounds present in all four quadrants. No organomegaly or mass. EXTREMITIES: No pedal edema, cyanosis, or clubbing. NEUROLOGIC: Patient is unable to comply with full neurologic exam. She answers some questions including her name but cannot tell me where she is or her date of birth. She does say yes or no appropriately however other times she is to speak but is unable to find words. There is a right-sided drooping of the mouth. She is able to show me her teeth but unable to puff out her cheeks. Muscle strength 4/5 in  right upper and lower extremities, 5 out of 5 in left upper and lower extremities. Gait not checked. SKIN: Warm, dry, and intact without obvious rash, lesion, or ulcer.   Labs on Admission:  CBC:  Recent Labs Lab 05/15/16 1607  WBC 11.5*  NEUTROABS 6.7*  HGB 15.3  HCT 45.7  MCV 92.1  PLT 272   Basic Metabolic Panel:  Recent Labs Lab 05/15/16 1607  NA 138  K 3.1*  CL 100*  CO2 28  GLUCOSE 175*  BUN 17  CREATININE 0.66  CALCIUM 10.0  MG 1.6*   GFR: Estimated Creatinine Clearance: 43.1 mL/min (by C-G formula based on SCr of 0.66 mg/dL). Liver Function Tests:  Recent Labs Lab 05/15/16 1607  AST 28  ALT 19   ALKPHOS 85  BILITOT 0.5  PROT 8.6*  ALBUMIN 4.5    Recent Labs Lab 05/15/16 1607  LIPASE 18   No results for input(s): AMMONIA in the last 168 hours. Coagulation Profile:  Recent Labs Lab 05/15/16 1607  INR 0.95   Cardiac Enzymes:  Recent Labs Lab 05/15/16 1607  TROPONINI <0.03    Urine analysis:    Component Value Date/Time   COLORURINE STRAW (A) 05/15/2016 1738   APPEARANCEUR CLEAR (A) 05/15/2016 1738   LABSPEC 1.006 05/15/2016 1738   PHURINE 8.0 05/15/2016 1738   GLUCOSEU 50 (A) 05/15/2016 1738   HGBUR NEGATIVE 05/15/2016 1738   BILIRUBINUR NEGATIVE 05/15/2016 1738   KETONESUR NEGATIVE 05/15/2016 1738   PROTEINUR NEGATIVE 05/15/2016 1738   NITRITE NEGATIVE 05/15/2016 1738   LEUKOCYTESUR NEGATIVE 05/15/2016 1738     Radiological Exams on Admission: Ct Head Wo Contrast  Result Date: 05/15/2016 CLINICAL DATA:  Per family pt woke up this am not acting right. Pt normally very  active around the house, today pt had confusion and is not her normal self. Pt with hx of seizures 30+ years ago and hx of B-cell ca. EXAM: CT HEAD WITHOUT CONTRAST TECHNIQUE: Contiguous axial images were obtained from the base of the skull through the vertex without intravenous contrast. COMPARISON:  MRI of the brain on 03/10/2006 FINDINGS: Brain: There is focal low-attenuation within the left caudate nucleus, internal capsule, globus pallidus, and putamen, consistent with probable subacute or chronic infarct. No associated hemorrhage. The findings are new since the prior MRI. There is moderate atrophy. Significant periventricular white matter changes are consistent with small vessel disease. There is no intra or extra-axial fluid collection or mass lesion. The basilar cisterns and ventricles have a normal appearance. There is no CT evidence for acute infarction or hemorrhage. Vascular: There is atherosclerotic calcification of the carotid siphons. Skull: Normal. Negative for fracture or focal  lesion. Sinuses/Orbits: There is moderate mucoperiosteal thickening involving the ethmoid and sphenoid air cells. Orbits are unremarkable in appearance. Mastoid air cells are normally aerated. Other: None IMPRESSION: 1. Interval infarct of the left basal ganglia not associated with hemorrhage. The appearance favors a subacute or chronic process. 2. Atrophy and small vessel disease. 3. Moderate paranasal sinus disease. Critical Value/emergent results were called by telephone at the time of interpretation on 05/15/2016 at 5:49 pm to Dr. Roxan Hockeyobinson, who verbally acknowledged these results. Electronically Signed   By: Norva PavlovElizabeth  Brown M.D.   On: 05/15/2016 17:50    EKG: Normal sinus rhyth65 with normal axis and nonspecific ST-T wave changes.   Assessment/Plan  This is a 82 y.o. female with a history of hypertension, diabetes, dementia and B-cell lymphomanow being admitted with:  1. CVA of the left basal ganglia, read as subacute versus chronic however given timeline most likely to have occurred in the last 48 hours. - Admit telemetry inpatient for neuro workup including: - Studies: MRA/MRI, Echo, Carotids - Labs: CBC, BMP, Lipids, TFTs, A1C - Nursing: Neurochecks, O2, dysphagia screen, permissive hypertension.  - Consults: Neurology, PT/OT, S/S consults.  - Meds: Daily aspirin. - Fluids: IVNS@75cc /hr.   - Routine DVT Px: with Lovenox, SCDs, early ambulation  2. History of diabetes-cover with regular insulin sliding scale. Hold glipizide and metformin  3. History of hypertension-hold Norvasc for permissive hypertension 4. History of dementia/depression-hold Zoloft  Hold vitamins secondary to nothing by mouth status.  Admission status: Inpatient with cardiac monitoring to stroke unit  Fluids: IV normal saline Diet/Nutrition: Nothing by mouth Consults called: Neurology, PT, OT, speech and swallowing DVT Px: Lovenox, SCDs and early ambulation Code Status: Full Code -please note patient's  daughter indicates that the patient would like to be DO NOT RESUSCITATE however the daughter is her healthcare proxy and will stay by her bedside. She'll attempt to find living will paperwork and may be willing to sign a MOLST form.  Consider palliative consult.  Disposition Plan: Likely to rehabilitation  All records are reviewed and case discussed with ED provider. Management plans discussed with the patient and/or family who express understanding and agree with plan of care.  Katrinka Herbison D.O. on 05/15/2016 at 7:11 PM Between 7am to 6pm - Pager - [phone] After 6pm go to [email or web address] - Social research officer, governmentpassword EPAS [facility] [place] [facility] Office [phone] CC: Primary care physician; Barbette ReichmannHANDE,VISHWANATH, MD  05/15/2016, 7:11 PM

## 2016-05-16 ENCOUNTER — Inpatient Hospital Stay: Payer: Commercial Managed Care - HMO

## 2016-05-16 ENCOUNTER — Inpatient Hospital Stay
Admit: 2016-05-16 | Discharge: 2016-05-16 | Disposition: A | Discharge status: Home / Self Care | Payer: Commercial Managed Care - HMO | Attending: Family Medicine | Admitting: Family Medicine

## 2016-05-16 DIAGNOSIS — I639 Cerebral infarction, unspecified: Secondary | ICD-10-CM

## 2016-05-16 LAB — LIPID PANEL
CHOL/HDL RATIO: 3.5 ratio
CHOLESTEROL: 184 mg/dL (ref 0–200)
HDL: 52 mg/dL (ref >40)
LDL Cholesterol: 110 mg/dL — ABNORMAL HIGH (ref 0–99)
TRIGLYCERIDES: 108 mg/dL (ref <150)
VLDL: 22 mg/dL (ref 0–40)

## 2016-05-16 LAB — ECHOCARDIOGRAM COMPLETE
HEIGHTINCHES: 62 in
Weight: 2233.6 oz

## 2016-05-16 LAB — GLUCOSE, CAPILLARY
GLUCOSE-CAPILLARY: 154 mg/dL — AB (ref 65–99)
GLUCOSE-CAPILLARY: 142 mg/dL — AB (ref 65–99)
GLUCOSE-CAPILLARY: 171 mg/dL — AB (ref 65–99)
GLUCOSE-CAPILLARY: 193 mg/dL — AB (ref 65–99)
GLUCOSE-CAPILLARY: 218 mg/dL — AB (ref 65–99)
Glucose-Capillary: 133 mg/dL — ABNORMAL HIGH (ref 65–99)

## 2016-05-16 LAB — TROPONIN I
Troponin I: <0.03 ng/mL (ref <0.03)
Troponin I: <0.03 ng/mL (ref <0.03)

## 2016-05-16 MED ORDER — POTASSIUM CHLORIDE CRYS ER 20 MEQ PO TBCR
40.0000 meq | EXTENDED_RELEASE_TABLET | Freq: Once | ORAL | Status: AC
  Administered 2016-05-16: 13:00:00 40 meq via ORAL
  Filled 2016-05-16: qty 2

## 2016-05-16 MED ORDER — ATORVASTATIN CALCIUM 20 MG PO TABS
40.0000 mg | ORAL_TABLET | Freq: Every day | ORAL | Status: DC
  Administered 2016-05-16: 18:00:00 40 mg via ORAL
  Filled 2016-05-16: qty 2

## 2016-05-16 MED ORDER — ASPIRIN EC 81 MG PO TBEC
81.0000 mg | DELAYED_RELEASE_TABLET | Freq: Every day | ORAL | Status: DC
  Administered 2016-05-17: 09:00:00 81 mg via ORAL
  Filled 2016-05-16: qty 1

## 2016-05-16 MED ORDER — SERTRALINE HCL 25 MG PO TABS
25.0000 mg | ORAL_TABLET | Freq: Every day | ORAL | Status: DC
  Administered 2016-05-17: 09:00:00 25 mg via ORAL
  Filled 2016-05-16: qty 1

## 2016-05-16 MED ORDER — MAGNESIUM SULFATE 2 GM/50ML IV SOLN
2.0000 g | Freq: Once | INTRAVENOUS | Status: AC
  Administered 2016-05-16: 18:00:00 2 g via INTRAVENOUS
  Filled 2016-05-16: qty 50

## 2016-05-16 NOTE — Progress Notes (Signed)
OT Cancellation Note  Patient Details Name: Rose CrutchBeulah Jones MRN: [id number] DOB: [birth year]   Cancelled Treatment:    Reason Eval/Treat Not Completed: Patient at procedure or test/ unavailable  Olegario MessierElaine Vedansh Kerstetter, MS, OTR/L 05/16/2016, 10:09 AM

## 2016-05-16 NOTE — Progress Notes (Signed)
[facility] [place] [facility]   PATIENT NAME: Rose Jones    MR#:  [id number]  DATE OF BIRTH:  09-27-1925  SUBJECTIVE:  CHIEF COMPLAINT:  Patient is weak on the right side  REVIEW OF SYSTEMS:  CONSTITUTIONAL: No fever, fatigue or weakness.  EYES: No blurred or double vision.  EARS, NOSE, AND THROAT: No tinnitus or ear pain.  RESPIRATORY: No cough, shortness of breath, wheezing or hemoptysis.  CARDIOVASCULAR: No chest pain, orthopnea, edema.  GASTROINTESTINAL: No nausea, vomiting, diarrhea or abdominal pain.  GENITOURINARY: No dysuria, hematuria.  ENDOCRINE: No polyuria, nocturia,  HEMATOLOGY: No anemia, easy bruising or bleeding SKIN: No rash or lesion. MUSCULOSKELETAL: No joint pain or arthritis.   NEUROLOGIC:Right-sided weakness No tingling, numbness,  PSYCHIATRY: No anxiety or depression.   DRUG ALLERGIES:   Allergies  Allergen Reactions  . Estrogens Other (See Comments)    Unknown     VITALS:  Blood pressure (!) 159/65, pulse 63, temperature 97.9 F (36.6 C), temperature source Oral, resp. rate 18, height 5\' 2"  (1.575 m), weight 63.3 kg (139 lb 9.6 oz), SpO2 93 %.  PHYSICAL EXAMINATION:  GENERAL:  82 y.o.-year-old patient lying in the bed with no acute distress.  EYES: Pupils equal, round, reactive to light and accommodation. No scleral icterus. Extraocular muscles intact.  HEENT: Head atraumatic, normocephalic. Oropharynx and nasopharynx clear.  NECK:  Supple, no jugular venous distention. No thyroid enlargement, no tenderness.  LUNGS: Normal breath sounds bilaterally, no wheezing, rales,rhonchi or crepitation. No use of accessory muscles of respiration.  CARDIOVASCULAR: S1, S2 normal. No murmurs, rubs, or gallops.  ABDOMEN: Soft, nontender, nondistended. Bowel sounds present. No organomegaly or mass.  EXTREMITIES: No pedal edema, cyanosis, or clubbing.  NEUROLOGIC: Cranial nerves II through XII are intact. Muscle strength 4/5 in  Right upper extremity and lower extremity and 5 out of 5 in left upper and lower extremity  Sensation intact. Gait not checked.  PSYCHIATRIC: The patient is alert and oriented x 3.  SKIN: No obvious rash, lesion, or ulcer.    LABORATORY PANEL:   CBC  Recent Labs Lab 05/15/16 1607  WBC 11.5*  HGB 15.3  HCT 45.7  PLT 272   ------------------------------------------------------------------------------------------------------------------  Chemistries   Recent Labs Lab 05/15/16 1607  NA 138  K 3.1*  CL 100*  CO2 28  GLUCOSE 175*  BUN 17  CREATININE 0.66  CALCIUM 10.0  MG 1.6*  AST 28  ALT 19  ALKPHOS 85  BILITOT 0.5   ------------------------------------------------------------------------------------------------------------------  Cardiac Enzymes  Recent Labs Lab 05/16/16 1554  TROPONINI <0.03   ------------------------------------------------------------------------------------------------------------------  RADIOLOGY:  Ct Head Wo Contrast  Result Date: 05/15/2016 CLINICAL DATA:  Per family pt woke up this am not acting right. Pt normally very active around the house, today pt had confusion and is not her normal self. Pt with hx of seizures 30+ years ago and hx of B-cell ca. EXAM: CT HEAD WITHOUT CONTRAST TECHNIQUE: Contiguous axial images were obtained from the base of the skull through the vertex without intravenous contrast. COMPARISON:  MRI of the brain on 03/10/2006 FINDINGS: Brain: There is focal low-attenuation within the left caudate nucleus, internal capsule, globus pallidus, and putamen, consistent with probable subacute or chronic infarct. No associated hemorrhage. The findings are new since the prior MRI. There is moderate atrophy. Significant periventricular white matter changes are consistent with small vessel disease. There is no intra or extra-axial fluid collection or mass lesion. The basilar cisterns and ventricles have a  normal appearance. There is  no CT evidence for acute infarction or hemorrhage. Vascular: There is atherosclerotic calcification of the carotid siphons. Skull: Normal. Negative for fracture or focal lesion. Sinuses/Orbits: There is moderate mucoperiosteal thickening involving the ethmoid and sphenoid air cells. Orbits are unremarkable in appearance. Mastoid air cells are normally aerated. Other: None IMPRESSION: 1. Interval infarct of the left basal ganglia not associated with hemorrhage. The appearance favors a subacute or chronic process. 2. Atrophy and small vessel disease. 3. Moderate paranasal sinus disease. Critical Value/emergent results were called by telephone at the time of interpretation on 05/15/2016 at 5:49 pm to Dr. Roxan Hockeyobinson, who verbally acknowledged these results. Electronically Signed   By: Norva PavlovElizabeth  Brown M.D.   On: 05/15/2016 17:50   Mr Brain Wo Contrast  Addendum Date: 05/16/2016   ADDENDUM REPORT: 05/16/2016 12:25 ADDENDUM: Study discussed by telephone with Dr. Cheron SchaumannGouro on 05/16/2016 At 1222 hours. Electronically Signed   By: Odessa FlemingH  Hall M.D.   On: 05/16/2016 12:25   Result Date: 05/16/2016 CLINICAL DATA:  82 year old female with abrupt onset mental status changes 2 days prior to presentation. Initial encounter. EXAM: MRI HEAD WITHOUT CONTRAST MRA HEAD WITHOUT CONTRAST TECHNIQUE: Multiplanar, multiecho pulse sequences of the brain and surrounding structures were obtained without intravenous contrast. Angiographic images of the head were obtained using MRA technique without contrast. COMPARISON:  Head CT without contrast 05/15/2016. Brain MRI 03/10/2006. FINDINGS: MRI HEAD FINDINGS Brain: Confluent 3 cm area of restricted diffusion involving much of the left basal ganglia, and a portion of the medial left corona radiata. This corresponds to the recent CT finding. Associated T2 and FLAIR hyperintensity. Evidence of associated petechial hemorrhage on series 14, image 16, but no mass effect. No other restricted diffusion.  Since 2007 progressed bilateral mostly periventricular cerebral white matter T2 and FLAIR hyperintensity, now patchy and confluent in areas. No cortical encephalomalacia identified. No other cerebral blood products identified. The right basal ganglia, thalami, brainstem, and cerebellum remain normal. No midline shift, mass effect, evidence of mass lesion, ventriculomegaly, extra-axial collection. Cervicomedullary junction and pituitary are within normal limits. Vascular: Major intracranial vascular flow voids are stable since 2007. Skull and upper cervical spine: Negative. Visualized bone marrow signal is within normal limits. Sinuses/Orbits: Stable and negative orbits soft tissues. Mild to moderate paranasal sinus mucosal thickening has progressed, maximal in the anterior ethmoids and frontal sinuses. Other: Trace right mastoid effusion. Trace retained secretions in the nasopharynx. Negative scalp soft tissues. MRA HEAD FINDINGS Antegrade flow in the posterior circulation with fairly codominant distal vertebral arteries. Artifactual signal loss in the proximal V4 segments. No distal vertebral stenosis. Negative basilar artery. SCA and PCA origins are normal. Posterior communicating arteries are diminutive or absent. Normal PCA branches. Antegrade flow in both ICA siphons. Tortuous distal cervical ICAs. No siphon stenosis. Normal ophthalmic artery origins. Patent carotid termini. Normal MCA and ACA origins. Left A1 segment appears dominant. Anterior communicating artery and visualized ACA branches are within normal limits. Right MCA M1 segment, bifurcation, and visible right MCA branches are within normal limits. Left MCA M1 segment is within normal limits. Left MCA bifurcation is patent. No left MCA branch occlusion identified. IMPRESSION: 1. Confluent acute infarct in the left basal ganglia corresponding to the recent CT finding. Petechial hemorrhage but no associated mass effect. 2. No other acute intracranial  abnormality. Mild for age nonspecific cerebral white matter changes with progression since 2007. 3.  Negative intracranial MRA. Electronically Signed: By: Odessa FlemingH  Hall M.D. On: 05/16/2016 10:40  [place] Carotid Bilateral (at [facility] Only)  Result Date: 05/16/2016 CLINICAL DATA:  CVA. EXAM: BILATERAL CAROTID DUPLEX ULTRASOUND TECHNIQUE: Wallace CullensGray scale imaging, color Doppler and duplex ultrasound were performed of bilateral carotid and vertebral arteries in the neck. COMPARISON:  MRI 05/16/2016 . FINDINGS: Criteria: Quantification of carotid stenosis is based on velocity parameters that correlate the residual internal carotid diameter with NASCET-based stenosis levels, using the diameter of the distal internal carotid lumen as the denominator for stenosis measurement. The following velocity measurements were obtained: RIGHT ICA:  51/16 cm/sec CCA:  53/6 cm/sec SYSTOLIC ICA/CCA RATIO:  1.0 DIASTOLIC ICA/CCA RATIO:  2.5 ECA:  63 cm/sec LEFT ICA:  46/7 cm/sec CCA:  53/8 cm/sec SYSTOLIC ICA/CCA RATIO:  0.9 DIASTOLIC ICA/CCA RATIO:  0.9 ECA:  50 cm/sec RIGHT CAROTID ARTERY: Mild right carotid bifurcation punctate plaque. No flow limiting stenosis. RIGHT VERTEBRAL ARTERY:  Patent with antegrade flow. LEFT CAROTID ARTERY: No significant carotid atherosclerotic vascular disease. LEFT VERTEBRAL ARTERY:  Patent with antegrade flow. IMPRESSION: 1. Mild right carotid bifurcation punctate plaque. No flow limiting stenosis. Degree of stenosis less than 50%. 2.  No significant left carotid atherosclerotic-disease. 3. Vertebral arteries are patent antegrade flow. Electronically Signed   By: Maisie Fushomas  Register   On: 05/16/2016 11:38   Mr Maxine GlennMra Head/brain Wo Cm  Addendum Date: 05/16/2016   ADDENDUM REPORT: 05/16/2016 12:25 ADDENDUM: Study discussed by telephone with Dr. Cheron SchaumannGouro on 05/16/2016 At 1222 hours. Electronically Signed   By: Odessa FlemingH  Hall M.D.   On: 05/16/2016 12:25   Result Date: 05/16/2016 CLINICAL DATA:  82 year old female with  abrupt onset mental status changes 2 days prior to presentation. Initial encounter. EXAM: MRI HEAD WITHOUT CONTRAST MRA HEAD WITHOUT CONTRAST TECHNIQUE: Multiplanar, multiecho pulse sequences of the brain and surrounding structures were obtained without intravenous contrast. Angiographic images of the head were obtained using MRA technique without contrast. COMPARISON:  Head CT without contrast 05/15/2016. Brain MRI 03/10/2006. FINDINGS: MRI HEAD FINDINGS Brain: Confluent 3 cm area of restricted diffusion involving much of the left basal ganglia, and a portion of the medial left corona radiata. This corresponds to the recent CT finding. Associated T2 and FLAIR hyperintensity. Evidence of associated petechial hemorrhage on series 14, image 16, but no mass effect. No other restricted diffusion. Since 2007 progressed bilateral mostly periventricular cerebral white matter T2 and FLAIR hyperintensity, now patchy and confluent in areas. No cortical encephalomalacia identified. No other cerebral blood products identified. The right basal ganglia, thalami, brainstem, and cerebellum remain normal. No midline shift, mass effect, evidence of mass lesion, ventriculomegaly, extra-axial collection. Cervicomedullary junction and pituitary are within normal limits. Vascular: Major intracranial vascular flow voids are stable since 2007. Skull and upper cervical spine: Negative. Visualized bone marrow signal is within normal limits. Sinuses/Orbits: Stable and negative orbits soft tissues. Mild to moderate paranasal sinus mucosal thickening has progressed, maximal in the anterior ethmoids and frontal sinuses. Other: Trace right mastoid effusion. Trace retained secretions in the nasopharynx. Negative scalp soft tissues. MRA HEAD FINDINGS Antegrade flow in the posterior circulation with fairly codominant distal vertebral arteries. Artifactual signal loss in the proximal V4 segments. No distal vertebral stenosis. Negative basilar artery.  SCA and PCA origins are normal. Posterior communicating arteries are diminutive or absent. Normal PCA branches. Antegrade flow in both ICA siphons. Tortuous distal cervical ICAs. No siphon stenosis. Normal ophthalmic artery origins. Patent carotid termini. Normal MCA and ACA origins. Left A1 segment appears dominant. Anterior communicating artery and visualized ACA branches are within normal  limits. Right MCA M1 segment, bifurcation, and visible right MCA branches are within normal limits. Left MCA M1 segment is within normal limits. Left MCA bifurcation is patent. No left MCA branch occlusion identified. IMPRESSION: 1. Confluent acute infarct in the left basal ganglia corresponding to the recent CT finding. Petechial hemorrhage but no associated mass effect. 2. No other acute intracranial abnormality. Mild for age nonspecific cerebral white matter changes with progression since 2007. 3.  Negative intracranial MRA. Electronically Signed: By: Odessa Fleming M.D. On: 05/16/2016 10:40    EKG:   Orders placed or performed during the hospital encounter of 05/15/16  . EKG 12-Lead  . EKG 12-Lead    ASSESSMENT AND PLAN:   This is a 82 y.o. female with a history of hypertension, diabetes, dementia and B-cell lymphomanow being admitted with:  1. CVA of the left basal ganglia,  CT read as subacute versus chronic however given timeline most likely to have occurred in the last 48 hours. - - Studies: MRA/MRI, with the acute infarct in the left basal ganglia Ech 50% ejection fraction,  -Carotids no significant stenosis - Labs: Lipids,-LDL 110 - Nursing: Neurochecks  - Fluids: IVNS@75cc /hr.   Patient is started on aspirin 81 mg enteric coated and Lipitor 40 mg once daily Appreciate neurology recommendations -PT is recommending home health PT and supervision. Ambulation with the rolling walker with 5 inch wheels - Routine DVT Px: with Lovenox, SCDs, early ambulation  2. History of diabetes-cover with regular  insulin sliding scale. Hold glipizide and metformin  3. History of hypertension-hold Norvasc for permissive hypertension 4. History of dementia/depression- Zoloft  5. Hypomagnesemia and hypokalemia replete and check labs in a.m.   All the records are reviewed and case discussed with Care Management/Social Workerr. Management plans discussed with the patient, family and they are in agreement.  CODE STATUS: dnr  TOTAL TIME TAKING CARE OF THIS PATIENT: 39 minutes.   POSSIBLE D/C IN 1-2DAYS, DEPENDING ON CLINICAL CONDITION.  Note: This dictation was prepared with Dragon dictation along with smaller phrase technology. Any transcriptional errors that result from this process are unintentional.   Ramonita Lab M.D on 05/16/2016 at 4:58 PM  Between 7am to 6pm - Pager - [phone] After 6pm go to [email or web address] - password [email or web address] [facility]  [facility]  Office  [phone]  CC: Primary care physician; Barbette Reichmann, MD

## 2016-05-16 NOTE — Consult Note (Signed)
Referring Physician: Gouru    Chief Complaint: Altered   HPI: Rose Jones is an 82 y.o. female who by report of the family has not been "right" for the past week with them noticing different things.  On Friday the patient awakened and seemed weaker and more lethargic.  They felt it was because she had not been out much and went to the mall that evening.  On returning the patient did not talk much and seemed to be unable to perform her own ADL's which previously was not a problem for her.  With no improvement over the weekend the patient was brought in for evaluation.  Initial NIHSS of 6.    Date last known well: Unable to determine Time last known well: Unable to determine tPA Given: No: Unable to determine LKW  Past Medical History:  Diagnosis Date  . Dementia   . Diabetes mellitus without complication (HCC)   . History of B-cell lymphoma   . Hypertension     History reviewed. No pertinent surgical history.  Family History  Problem Relation Age of Onset  . Diabetes Father     Died at 61  . Diabetes Sister    Social History:  reports that she has never smoked. She has never used smokeless tobacco. She reports that she does not drink alcohol or use drugs.  Allergies:  Allergies  Allergen Reactions  . Estrogens Other (See Comments)    Unknown     Medications:  I have reviewed the patient's current medications. Prior to Admission:  Prescriptions Prior to Admission  Medication Sig Dispense Refill Last Dose  . amLODipine (NORVASC) 5 MG tablet Take 1 tablet by mouth daily.   05/15/2016 at 0900  . Calcium Carbonate-Vitamin D (CALTRATE 600+D PO) Take 1 tablet by mouth daily.   05/15/2016 at 0900  . glipiZIDE (GLUCOTROL) 10 MG tablet Take 1 tablet by mouth daily. 5mg  at night and 10mg  in the morning   05/15/2016 at 0900  . metFORMIN (GLUCOPHAGE) 500 MG tablet Take 1 tablet by mouth 2 (two) times daily.   05/15/2016 at 0900  . sertraline (ZOLOFT) 25 MG tablet Take 1 tablet by  mouth daily.   05/15/2016 at Unknown time  . vitamin B-12 (CYANOCOBALAMIN) 1000 MCG tablet Take 1 tablet by mouth daily.   05/15/2016 at 0900  . Vitamin D, Ergocalciferol, (DRISDOL) 50000 units CAPS capsule Take 1 capsule by mouth once a week.   05/10/2016 at 0900   Scheduled: . aspirin EC  81 mg Oral Daily  . atorvastatin  40 mg Oral q1800  . enoxaparin (LOVENOX) injection  40 mg Subcutaneous Q24H  . insulin aspart  0-15 Units Subcutaneous Q4H    ROS: History obtained from the patient and family  General ROS: negative for - chills, fatigue, fever, night sweats, weight gain or weight loss Psychological ROS: negative for - behavioral disorder, hallucinations, memory difficulties, mood swings or suicidal ideation Ophthalmic ROS: negative for - blurry vision, double vision, eye pain or loss of vision ENT ROS: negative for - epistaxis, nasal discharge, oral lesions, sore throat, tinnitus or vertigo Allergy and Immunology ROS: negative for - hives or itchy/watery eyes Hematological and Lymphatic ROS: negative for - bleeding problems, bruising or swollen lymph nodes Endocrine ROS: negative for - galactorrhea, hair pattern changes, polydipsia/polyuria or temperature intolerance Respiratory ROS: negative for - cough, hemoptysis, shortness of breath or wheezing Cardiovascular ROS: negative for - chest pain, dyspnea on exertion, edema or irregular heartbeat Gastrointestinal ROS: negative for -  abdominal pain, diarrhea, hematemesis, nausea/vomiting or stool incontinence Genito-Urinary ROS: negative for - dysuria, hematuria, incontinence or urinary frequency/urgency Musculoskeletal ROS: negative for - joint swelling or muscular weakness Neurological ROS: as noted in HPI Dermatological ROS: negative for rash and skin lesion changes  Physical Examination: Blood pressure (!) 159/65, pulse 63, temperature 97.9 F (36.6 C), temperature source Oral, resp. rate 18, height 5\' 2"  (1.575 m), weight 63.3 kg  (139 lb 9.6 oz), SpO2 93 %.  HEENT-  Normocephalic, no lesions, without obvious abnormality.  Normal external eye and conjunctiva.  Normal TM's bilaterally.  Normal auditory canals and external ears. Normal external nose, mucus membranes and septum.  Normal pharynx. Cardiovascular- S1, S2 normal, pulses palpable throughout   Lungs- chest clear, no wheezing, rales, normal symmetric air entry Abdomen- soft, non-tender; bowel sounds normal; no masses,  no organomegaly Extremities- no edema Lymph-no adenopathy palpable Musculoskeletal-no joint tenderness, deformity or swelling Skin-warm and dry, no hyperpigmentation, vitiligo, or suspicious lesions  Neurological Examination Mental Status: Alert.  Unable to follow commands.  Speech minimal but fluent.  Response not always appropriate for questions being asked. Cranial Nerves: II: Discs flat bilaterally; Blinks to bilateral confrontation.  Pupils equal, round, reactive to light and accommodation III,IV, VI: ptosis not present, extra-ocular motions intact bilaterally V,VII: mild right facial asymmetry, facial light touch sensation normal bilaterally VIII: hearing normal bilaterally IX,X: gag reflex present XI: bilateral shoulder shrug XII: midline tongue extension Motor: Able to lift all extremities against gravity.  Unable to formally test Sensory: Responds to light noxious stimuli throughout Deep Tendon Reflexes: 1+ in the upper extremities and absent in the lower extremities.   Plantars: Right: mute   Left: mute Cerebellar: Unable to perform secondary to ability to follow commands Gait: not tested due to safety concerns   Laboratory Studies:  Basic Metabolic Panel:  Recent Labs Lab 05/15/16 1607  NA 138  K 3.1*  CL 100*  CO2 28  GLUCOSE 175*  BUN 17  CREATININE 0.66  CALCIUM 10.0  MG 1.6*    Liver Function Tests:  Recent Labs Lab 05/15/16 1607  AST 28  ALT 19  ALKPHOS 85  BILITOT 0.5  PROT 8.6*  ALBUMIN 4.5     Recent Labs Lab 05/15/16 1607  LIPASE 18   No results for input(s): AMMONIA in the last 168 hours.  CBC:  Recent Labs Lab 05/15/16 1607  WBC 11.5*  NEUTROABS 6.7*  HGB 15.3  HCT 45.7  MCV 92.1  PLT 272    Cardiac Enzymes:  Recent Labs Lab 05/15/16 1607 05/15/16 2314 05/16/16 0457 05/16/16 1323  TROPONINI <0.03 <0.03 <0.03 <0.03    BNP: Invalid input(s): POCBNP  CBG:  Recent Labs Lab 05/15/16 2333 05/16/16 0111 05/16/16 0403 05/16/16 0809 05/16/16 1200  GLUCAP 190* 154* 142* 133* 171*    Microbiology: No results found for this or any previous visit.  Coagulation Studies:  Recent Labs  05/15/16 1607  LABPROT 12.7  INR 0.95    Urinalysis:  Recent Labs Lab 05/15/16 1738  COLORURINE STRAW*  LABSPEC 1.006  PHURINE 8.0  GLUCOSEU 50*  HGBUR NEGATIVE  BILIRUBINUR NEGATIVE  KETONESUR NEGATIVE  PROTEINUR NEGATIVE  NITRITE NEGATIVE  LEUKOCYTESUR NEGATIVE    Lipid Panel:    Component Value Date/Time   CHOL 184 05/16/2016 0457   TRIG 108 05/16/2016 0457   HDL 52 05/16/2016 0457   CHOLHDL 3.5 05/16/2016 0457   VLDL 22 05/16/2016 0457   LDLCALC 110 (H) 05/16/2016 0457  HgbA1C: No results found for: HGBA1C  Urine Drug Screen:     Component Value Date/Time   LABOPIA NONE DETECTED 05/15/2016 1738   COCAINSCRNUR NONE DETECTED 05/15/2016 1738   LABBENZ NONE DETECTED 05/15/2016 1738   AMPHETMU NONE DETECTED 05/15/2016 1738   THCU NONE DETECTED 05/15/2016 1738   LABBARB NONE DETECTED 05/15/2016 1738    Alcohol Level:  Recent Labs Lab 05/15/16 1607  ETH <5    Other results: EKG: sinus rhythm at 65 bpm.  Imaging: Ct Head Wo Contrast  Result Date: 05/15/2016 CLINICAL DATA:  Per family pt woke up this am not acting right. Pt normally very active around the house, today pt had confusion and is not her normal self. Pt with hx of seizures 30+ years ago and hx of B-cell ca. EXAM: CT HEAD WITHOUT CONTRAST TECHNIQUE: Contiguous  axial images were obtained from the base of the skull through the vertex without intravenous contrast. COMPARISON:  MRI of the brain on 03/10/2006 FINDINGS: Brain: There is focal low-attenuation within the left caudate nucleus, internal capsule, globus pallidus, and putamen, consistent with probable subacute or chronic infarct. No associated hemorrhage. The findings are new since the prior MRI. There is moderate atrophy. Significant periventricular white matter changes are consistent with small vessel disease. There is no intra or extra-axial fluid collection or mass lesion. The basilar cisterns and ventricles have a normal appearance. There is no CT evidence for acute infarction or hemorrhage. Vascular: There is atherosclerotic calcification of the carotid siphons. Skull: Normal. Negative for fracture or focal lesion. Sinuses/Orbits: There is moderate mucoperiosteal thickening involving the ethmoid and sphenoid air cells. Orbits are unremarkable in appearance. Mastoid air cells are normally aerated. Other: None IMPRESSION: 1. Interval infarct of the left basal ganglia not associated with hemorrhage. The appearance favors a subacute or chronic process. 2. Atrophy and small vessel disease. 3. Moderate paranasal sinus disease. Critical Value/emergent results were called by telephone at the time of interpretation on 05/15/2016 at 5:49 pm to Dr. Roxan Hockey, who verbally acknowledged these results. Electronically Signed   By: Norva Pavlov M.D.   On: 05/15/2016 17:50   Mr Brain Wo Contrast  Addendum Date: 05/16/2016   ADDENDUM REPORT: 05/16/2016 12:25 ADDENDUM: Study discussed by telephone with Dr. Cheron Schaumann on 05/16/2016 At 1222 hours. Electronically Signed   By: Odessa Fleming M.D.   On: 05/16/2016 12:25   Result Date: 05/16/2016 CLINICAL DATA:  82 year old female with abrupt onset mental status changes 2 days prior to presentation. Initial encounter. EXAM: MRI HEAD WITHOUT CONTRAST MRA HEAD WITHOUT CONTRAST TECHNIQUE:  Multiplanar, multiecho pulse sequences of the brain and surrounding structures were obtained without intravenous contrast. Angiographic images of the head were obtained using MRA technique without contrast. COMPARISON:  Head CT without contrast 05/15/2016. Brain MRI 03/10/2006. FINDINGS: MRI HEAD FINDINGS Brain: Confluent 3 cm area of restricted diffusion involving much of the left basal ganglia, and a portion of the medial left corona radiata. This corresponds to the recent CT finding. Associated T2 and FLAIR hyperintensity. Evidence of associated petechial hemorrhage on series 14, image 16, but no mass effect. No other restricted diffusion. Since 2007 progressed bilateral mostly periventricular cerebral white matter T2 and FLAIR hyperintensity, now patchy and confluent in areas. No cortical encephalomalacia identified. No other cerebral blood products identified. The right basal ganglia, thalami, brainstem, and cerebellum remain normal. No midline shift, mass effect, evidence of mass lesion, ventriculomegaly, extra-axial collection. Cervicomedullary junction and pituitary are within normal limits. Vascular: Major intracranial vascular flow voids  are stable since 2007. Skull and upper cervical spine: Negative. Visualized bone marrow signal is within normal limits. Sinuses/Orbits: Stable and negative orbits soft tissues. Mild to moderate paranasal sinus mucosal thickening has progressed, maximal in the anterior ethmoids and frontal sinuses. Other: Trace right mastoid effusion. Trace retained secretions in the nasopharynx. Negative scalp soft tissues. MRA HEAD FINDINGS Antegrade flow in the posterior circulation with fairly codominant distal vertebral arteries. Artifactual signal loss in the proximal V4 segments. No distal vertebral stenosis. Negative basilar artery. SCA and PCA origins are normal. Posterior communicating arteries are diminutive or absent. Normal PCA branches. Antegrade flow in both ICA siphons.  Tortuous distal cervical ICAs. No siphon stenosis. Normal ophthalmic artery origins. Patent carotid termini. Normal MCA and ACA origins. Left A1 segment appears dominant. Anterior communicating artery and visualized ACA branches are within normal limits. Right MCA M1 segment, bifurcation, and visible right MCA branches are within normal limits. Left MCA M1 segment is within normal limits. Left MCA bifurcation is patent. No left MCA branch occlusion identified. IMPRESSION: 1. Confluent acute infarct in the left basal ganglia corresponding to the recent CT finding. Petechial hemorrhage but no associated mass effect. 2. No other acute intracranial abnormality. Mild for age nonspecific cerebral white matter changes with progression since 2007. 3.  Negative intracranial MRA. Electronically Signed: By: Odessa FlemingH  Hall M.D. On: 05/16/2016 10:40   [place] Carotid Bilateral (at [facility] And [facility] Only)  Result Date: 05/16/2016 CLINICAL DATA:  CVA. EXAM: BILATERAL CAROTID DUPLEX ULTRASOUND TECHNIQUE: Wallace CullensGray scale imaging, color Doppler and duplex ultrasound were performed of bilateral carotid and vertebral arteries in the neck. COMPARISON:  MRI 05/16/2016 . FINDINGS: Criteria: Quantification of carotid stenosis is based on velocity parameters that correlate the residual internal carotid diameter with NASCET-based stenosis levels, using the diameter of the distal internal carotid lumen as the denominator for stenosis measurement. The following velocity measurements were obtained: RIGHT ICA:  51/16 cm/sec CCA:  53/6 cm/sec SYSTOLIC ICA/CCA RATIO:  1.0 DIASTOLIC ICA/CCA RATIO:  2.5 ECA:  63 cm/sec LEFT ICA:  46/7 cm/sec CCA:  53/8 cm/sec SYSTOLIC ICA/CCA RATIO:  0.9 DIASTOLIC ICA/CCA RATIO:  0.9 ECA:  50 cm/sec RIGHT CAROTID ARTERY: Mild right carotid bifurcation punctate plaque. No flow limiting stenosis. RIGHT VERTEBRAL ARTERY:  Patent with antegrade flow. LEFT CAROTID ARTERY: No significant carotid atherosclerotic vascular disease. LEFT  VERTEBRAL ARTERY:  Patent with antegrade flow. IMPRESSION: 1. Mild right carotid bifurcation punctate plaque. No flow limiting stenosis. Degree of stenosis less than 50%. 2.  No significant left carotid atherosclerotic-disease. 3. Vertebral arteries are patent antegrade flow. Electronically Signed   By: Maisie Fushomas  Register   On: 05/16/2016 11:38   Mr Maxine GlennMra Head/brain Wo Cm  Addendum Date: 05/16/2016   ADDENDUM REPORT: 05/16/2016 12:25 ADDENDUM: Study discussed by telephone with Dr. Cheron SchaumannGouro on 05/16/2016 At 1222 hours. Electronically Signed   By: Odessa FlemingH  Hall M.D.   On: 05/16/2016 12:25   Result Date: 05/16/2016 CLINICAL DATA:  82 year old female with abrupt onset mental status changes 2 days prior to presentation. Initial encounter. EXAM: MRI HEAD WITHOUT CONTRAST MRA HEAD WITHOUT CONTRAST TECHNIQUE: Multiplanar, multiecho pulse sequences of the brain and surrounding structures were obtained without intravenous contrast. Angiographic images of the head were obtained using MRA technique without contrast. COMPARISON:  Head CT without contrast 05/15/2016. Brain MRI 03/10/2006. FINDINGS: MRI HEAD FINDINGS Brain: Confluent 3 cm area of restricted diffusion involving much of the left basal ganglia, and a portion of the medial left corona radiata. This corresponds to  the recent CT finding. Associated T2 and FLAIR hyperintensity. Evidence of associated petechial hemorrhage on series 14, image 16, but no mass effect. No other restricted diffusion. Since 2007 progressed bilateral mostly periventricular cerebral white matter T2 and FLAIR hyperintensity, now patchy and confluent in areas. No cortical encephalomalacia identified. No other cerebral blood products identified. The right basal ganglia, thalami, brainstem, and cerebellum remain normal. No midline shift, mass effect, evidence of mass lesion, ventriculomegaly, extra-axial collection. Cervicomedullary junction and pituitary are within normal limits. Vascular: Major  intracranial vascular flow voids are stable since 2007. Skull and upper cervical spine: Negative. Visualized bone marrow signal is within normal limits. Sinuses/Orbits: Stable and negative orbits soft tissues. Mild to moderate paranasal sinus mucosal thickening has progressed, maximal in the anterior ethmoids and frontal sinuses. Other: Trace right mastoid effusion. Trace retained secretions in the nasopharynx. Negative scalp soft tissues. MRA HEAD FINDINGS Antegrade flow in the posterior circulation with fairly codominant distal vertebral arteries. Artifactual signal loss in the proximal V4 segments. No distal vertebral stenosis. Negative basilar artery. SCA and PCA origins are normal. Posterior communicating arteries are diminutive or absent. Normal PCA branches. Antegrade flow in both ICA siphons. Tortuous distal cervical ICAs. No siphon stenosis. Normal ophthalmic artery origins. Patent carotid termini. Normal MCA and ACA origins. Left A1 segment appears dominant. Anterior communicating artery and visualized ACA branches are within normal limits. Right MCA M1 segment, bifurcation, and visible right MCA branches are within normal limits. Left MCA M1 segment is within normal limits. Left MCA bifurcation is patent. No left MCA branch occlusion identified. IMPRESSION: 1. Confluent acute infarct in the left basal ganglia corresponding to the recent CT finding. Petechial hemorrhage but no associated mass effect. 2. No other acute intracranial abnormality. Mild for age nonspecific cerebral white matter changes with progression since 2007. 3.  Negative intracranial MRA. Electronically Signed: By: Odessa Fleming M.D. On: 05/16/2016 10:40    Assessment: 82 y.o. female presenting with an altered mental status.  Per neurological examination shows evidence of aphasia.  MRI of the brain reviewed and shows a left basal ganglia infarct with some associated petechial hemorrhage.  Infarct likely from small vessel disease.  Patient  with a history of HTN.  Carotid dopplers show no evidence of hemodynamically significant stenosis.  Echocardiogram shows no cardiac source of emboli with an EF of 50%.  A1c pending, LDL 110.   Stroke Risk Factors - diabetes mellitus and hypertension  Plan: 1. Statin for lipid management with target LDL<70. 2. PT consult, OT consult, Speech consult 3. Prophylactic therapy-Antiplatelet med: Aspirin - dose 81mg  daily 4. NPO until RN stroke swallow screen 5. Telemetry monitoring 6. Frequent neuro checks   Thana Farr, MD Neurology [phone] 05/16/2016, 2:59 PM

## 2016-05-16 NOTE — Evaluation (Signed)
Physical Therapy Evaluation Patient Details Name: Rose CrutchBeulah Jones MRN: [id number] DOB: 13-Oct-1925 Today's Date: 05/16/2016   History of Present Illness  82 yo female with L basal ganglia infarct, AMS, has chronic brain changes with dementia.  PMHx:  b cell lymphoma, HTN, DM,     Clinical Impression  Pt is up to stand and sidestep but due to being very wet needed a bath and asked nursing to assist with this.  Her plan is to continue acutely and will expect her to get home with family to get HHPT unless her condition changes.  PT will focus on strength and standing balance control to get her home.    Follow Up Recommendations Home health PT;Supervision/Assistance - 24 hour    Equipment Recommendations  Rolling walker with 5" wheels (if more appropriate after resting and walking more later)    Recommendations for Other Services       Precautions / Restrictions Precautions Precautions: Fall (telemetry) Precaution Comments: was independent with no AD previously Restrictions Weight Bearing Restrictions: No      Mobility  Bed Mobility Overal bed mobility: Needs Assistance Bed Mobility: Supine to Sit;Sit to Supine     Supine to sit: Min assist;Mod assist Sit to supine: Max assist   General bed mobility comments: pt needs cues for progression of movement with tactile and verbal prompts  Transfers Overall transfer level: Needs assistance Equipment used: 1 person hand held assist Transfers: Sit to/from Stand Sit to Stand: Min assist         General transfer comment: Pt has a very wet brief and needs a bath to clean up so could not move far  Ambulation/Gait Ambulation/Gait assistance: Min assist Ambulation Distance (Feet): 4 Feet Assistive device: 1 person hand held assist Gait Pattern/deviations: Step-to pattern;Trunk flexed;Wide base of support;Shuffle Gait velocity: reduced Gait velocity interpretation: Below normal speed for age/gender General Gait Details: has very  limited flexibility in ankles despite PLOF and could not balance well on them, sidestepped up bed  Stairs            Wheelchair Mobility    Modified Rankin (Stroke Patients Only)       Balance Overall balance assessment: Needs assistance Sitting-balance support: Feet supported;Bilateral upper extremity supported Sitting balance-Leahy Scale: Fair     Standing balance support: Bilateral upper extremity supported Standing balance-Leahy Scale: Poor                               Pertinent Vitals/Pain Pain Assessment: No/denies pain    Home Living Family/patient expects to be discharged to:: Private residence Living Arrangements: Children Available Help at Discharge: Family;Available 24 hours/day Type of Home: House Home Access: Level entry     Home Layout: One level Home Equipment: Cane - single point Additional Comments: pt was fairly independent previously    Prior Function Level of Independence: Independent;Needs assistance   Gait / Transfers Assistance Needed: independent  ADL's / Homemaking Assistance Needed: family cared for house and cooking        Hand Dominance        Extremity/Trunk Assessment   Upper Extremity Assessment Upper Extremity Assessment: Overall WFL for tasks assessed    Lower Extremity Assessment Lower Extremity Assessment: Generalized weakness    Cervical / Trunk Assessment Cervical / Trunk Assessment: Kyphotic  Communication   Communication: Expressive difficulties;Other (comment) (due to dementia history not sure if this is pre-existing)  Cognition Arousal/Alertness: Awake/alert (tired) Behavior During  Therapy: Flat affect Overall Cognitive Status: History of cognitive impairments - at baseline                      General Comments General comments (skin integrity, edema, etc.): Pt is not clear on what she is doing with mobility and not sure what she did previously but could step up side of bed but not  to ch air due to needing a bath from voiding all over bed and with clothing, brief    Exercises     Assessment/Plan    PT Assessment Patient needs continued PT services  PT Problem List Decreased strength;Decreased range of motion;Decreased activity tolerance;Decreased balance;Decreased mobility;Decreased coordination;Decreased cognition;Decreased knowledge of use of DME;Decreased safety awareness;Cardiopulmonary status limiting activity;Decreased skin integrity          PT Treatment Interventions DME instruction;Gait training;Functional mobility training;Therapeutic activities;Therapeutic exercise;Balance training;Neuromuscular re-education;Patient/family education    PT Goals (Current goals can be found in the Care Plan section)  Acute Rehab PT Goals Patient Stated Goal: none stated PT Goal Formulation: With patient/family Time For Goal Achievement: 05/30/16 Potential to Achieve Goals: Good    Frequency 7X/week   Barriers to discharge Other (comment) (has 24/7 help and may be more mobile after resting) can get family assistance for home    Co-evaluation               End of Session   Activity Tolerance: Patient tolerated treatment well;Patient limited by fatigue (had MRI this AM and is quite tired) Patient left: in bed;with call bell/phone within reach;with bed alarm set;with family/visitor present (contacted nursing about need for bath) Nurse Communication: Mobility status;Other (comment) (bathing)         Time: [id number] PT Time Calculation (min) (ACUTE ONLY): 25 min   Charges:   PT Evaluation $PT Eval Moderate Complexity: 1 Procedure PT Treatments $Therapeutic Activity: 8-22 mins   PT G Codes:        Ivar DrapeStout, Gevork Ayyad E 05/16/2016, 2:33 PM  Samul Dadauth Shelli Portilla, PT MS Acute Rehab Dept. Number: [facility] [phone] and [facility] [phone]

## 2016-05-16 NOTE — Plan of Care (Signed)
Problem: Education: Goal: Knowledge of [facility] Education information/materials will improve Outcome: Progressing Pt likes to be called Rose Jones  Past Medical History:  Diagnosis Date  . Dementia   . Diabetes mellitus without complication (HCC)   . History of B-cell lymphoma   . Hypertension    Pt is well controlled with home medications

## 2016-05-16 NOTE — Progress Notes (Signed)
PT Cancellation Note  Patient Details Name: Rose CrutchBeulah Jones MRN: [id number] DOB: 05-Nov-1925   Cancelled Treatment:    Reason Eval/Treat Not Completed: Other (comment). Consult received and chart reviewed. Pt currently out of room for testing at this time. Will re-attempt, time permitting. Spoke to daughter in room and received some history as pt currently with AMS. Per daughter, pt very independent and ambulatory without AD prior to admission. Positive for falls, however no injuries. Has SPC, does not use. 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 Currently complains of increased R side weakness.   Nishi Neiswonger 05/16/2016, 9:29 AM  Elizabeth PalauStephanie Jobany Montellano, PT, DPT [phone]

## 2016-05-16 NOTE — Evaluation (Addendum)
Clinical/Bedside Swallow Evaluation Patient Details  Name: Rose Jones MRN: [id number] Date of Birth: Jun 27, 1925  Today's Date: 05/16/2016 Time: SLP Start Time (ACUTE ONLY): 1145 SLP Stop Time (ACUTE ONLY): 1245 SLP Time Calculation (min) (ACUTE ONLY): 60 min  Past Medical History:  Past Medical History:  Diagnosis Date  . Dementia   . Diabetes mellitus without complication (HCC)   . History of B-cell lymphoma   . Hypertension    Past Surgical History: History reviewed. No pertinent surgical history. HPI:  Pt is a 82 y.o. female with a known history of diabetes, hypertension, dementia, B-cell lymphoma presents to the emergency department for evaluation of status.  Patient was in a usual state of health until 2 days ago when she experienced a sudden onset of mental status change. Patient's family stated that she was less interactive, very tired and not acting like herself after a long day out with family. Last time she was seen in her usual state of health was 12:15 around 7 PM. Since then she has been less communicative, not interested in her regular activities and sleeping more than usual. Patient's daughter and daughter-in-law indicates that she will occasionally respond to them with single word answers or shakes her head yes and no but most of the time she just looks at them with a blank stare. Currently, she is more alert and able to converse in short phrases and indicate basic wants/needs. Noted chuckling and reduced attention to conversation and task requiring moderate cues.    Assessment / Plan / Recommendation Clinical Impression  Pt appears at reduced risk for aspiration of thin liquids and po foods following general aspiration precautions and when given assistance during the meal; initiation of task disorientation, but w/ cues to focus to task, she fed self appropriately using her R hand. No overt s/s of aspiration were noted; oral phase appeared wfl for bolus management and oral  clearing. Pt does wear dentures when eating, and these were placed by family during this session. Pt exhibited some confusion during task initiation requiring gentle verbal/tactile cues and encouragement to proceed w/ self-feeding task; eating/drinking(she tended to want to hold the cup). She was able to feed herself and eat appropriately w/ the verbal support. Suspect this presentation could be related to the impact of an exacerbating event on her (baseline) declined Cognitive status/Dementia. Similar suspicion w/ regard to her reduced verbal output at this time. Noted MRI results of a basal ganglia infarct. Pt is communicating intermittently in a softer voice(sometimes a whisper) in word-phrase length utterances. Her verbal responses are intermittent, and at times she responds w/ a chuckle/smile. Will recommend further cognitive-linguistic assessment post discharge once pt is settled in her environment/routine(d/t her dx of Dementia). Pt will require full supervision if returning home.  Recommend a Dysphagia level 3 diet for easier mastication w/ meats, thin liquids. Recommend aspiration precautions; meds in Puree as needed for easier swallowing. Recommend assistance at all meals; f/u by Dietician for appropriate supplements as needed. Family and NSG updated.     Aspiration Risk   (reduced following aspiration precautions)    Diet Recommendation  Dysphagia level 3(w/ purees), Thin liquids; general aspiration precautions; assistance at meals. Reduce distractions.   Medication Administration: Whole meds with puree (as needed for easier, safer swallowing)    Other  Recommendations Recommended Consults:  (Dietician) Oral Care Recommendations: Oral care BID;Staff/trained caregiver to provide oral care   Follow up Recommendations None (TBD)      Frequency and Duration min 2x/week  1 week       Prognosis Prognosis for Safe Diet Advancement: Good Barriers to Reach Goals: Cognitive deficits (Dementia  dx baseline)      Swallow Study   General Date of Onset: 05/15/16 HPI: Pt is a 82 y.o. female with a known history of diabetes, hypertension, dementia, B-cell lymphoma presents to the emergency department for evaluation of status.  Patient was in a usual state of health until 2 days ago when she experienced a sudden onset of mental status change. Patient's family stated that she was less interactive, very tired and not acting like herself after a long day out with family. Last time she was seen in her usual state of health was 12:15 around 7 PM. Since then she has been less communicative, not interested in her regular activities and sleeping more than usual. Patient's daughter and daughter-in-law indicates that she will occasionally respond to them with single word answers or shakes her head yes and no but most of the time she just looks at them with a blank stare. Currently, she is more alert and able to converse in short phrases and indicate basic wants/needs. Noted chuckling and reduced attention to conversation and task requiring moderate cues.  Type of Study: Bedside Swallow Evaluation Previous Swallow Assessment: none indicated Diet Prior to this Study: Thin liquids;Regular ("good appetite") Temperature Spikes Noted: No (wbc 11.5) Respiratory Status: Room air History of Recent Intubation: No Behavior/Cognition: Alert;Cooperative;Pleasant mood;Confused;Requires cueing;Distractible Oral Cavity Assessment: Dry Oral Care Completed by SLP: Recent completion by staff Oral Cavity - Dentition: Dentures, top;Dentures, bottom Vision: Functional for self-feeding Self-Feeding Abilities: Able to feed self;Needs assist;Needs set up (Cognitive decline) Patient Positioning: Upright in bed Baseline Vocal Quality: Low vocal intensity (muttered, mumbled speech) Volitional Cough: Cognitively unable to elicit Volitional Swallow: Unable to elicit    Oral/Motor/Sensory Function Overall Oral Motor/Sensory  Function: Within functional limits   Ice Chips Ice chips: Impaired   Thin Liquid Thin Liquid: Within functional limits Presentation: Cup;Self Fed;Straw (assisted) Other Comments: ~3-4 ozs total. Pt had some initiation difficulty but given cues drank and fed self.     Nectar Thick Nectar Thick Liquid: Not tested   Honey Thick Honey Thick Liquid: Not tested   Puree Puree: Within functional limits Presentation: Self Fed;Spoon (assisted; 10 trials) Other Comments: initiation disorientation but w/ cues fed self   Solid   GO   Solid: Not tested Other Comments: will give trials at meal to further assess soft food consistency         Jerilynn SomKatherine Haydon Kalmar, MS, CCC-SLP Cadon Raczka 05/16/2016,2:48 PM

## 2016-05-16 NOTE — Progress Notes (Signed)
OT Cancellation Note  Patient Details Name: Rose CrutchBeulah Jones MRN: [id number] DOB: 04-17-1926   Cancelled Treatment:    Reason Eval/Treat Not Completed: Other (comment) Order received and chart reviewed.  Pt had just finished with PT and was getting a bath after being incontinent.  Will attempt again tomorrow. Thank you for the referral.  Susanne BordersSusan Wofford, OTR/L ascom [phone] 05/16/16, 4:26 PM

## 2016-05-16 NOTE — Progress Notes (Signed)
*  PRELIMINARY RESULTS* Echocardiogram 2D Echocardiogram has been performed.  Cristela BlueHege, Coti Burd 05/16/2016, 9:37 AM

## 2016-05-16 NOTE — Plan of Care (Signed)
Problem: SLP Dysphagia Goals Goal: Misc Dysphagia Goal Pt will safely tolerate po diet of least restrictive consistency w/ no overt s/s of aspiration noted by Staff/pt/family x3 sessions.    

## 2016-05-16 NOTE — Progress Notes (Signed)
Chaplain was making his rounds and visited with pt in room 116. Provided the ministry of prayer.    05/16/16 1300  Clinical Encounter Type  Visited With Patient  Visit Type Initial;Spiritual support  Referral From Nurse  Spiritual Encounters  Spiritual Needs Prayer

## 2016-05-17 DIAGNOSIS — I69320 Aphasia following cerebral infarction: Secondary | ICD-10-CM | POA: Diagnosis not present

## 2016-05-17 DIAGNOSIS — L309 Dermatitis, unspecified: Secondary | ICD-10-CM | POA: Diagnosis not present

## 2016-05-17 DIAGNOSIS — G301 Alzheimer's disease with late onset: Secondary | ICD-10-CM | POA: Diagnosis not present

## 2016-05-17 DIAGNOSIS — I10 Essential (primary) hypertension: Secondary | ICD-10-CM | POA: Diagnosis not present

## 2016-05-17 DIAGNOSIS — W19XXXA Unspecified fall, initial encounter: Secondary | ICD-10-CM | POA: Diagnosis not present

## 2016-05-17 DIAGNOSIS — R488 Other symbolic dysfunctions: Secondary | ICD-10-CM | POA: Diagnosis not present

## 2016-05-17 DIAGNOSIS — N390 Urinary tract infection, site not specified: Secondary | ICD-10-CM | POA: Diagnosis not present

## 2016-05-17 DIAGNOSIS — I6990 Unspecified sequelae of unspecified cerebrovascular disease: Secondary | ICD-10-CM | POA: Diagnosis not present

## 2016-05-17 DIAGNOSIS — R296 Repeated falls: Secondary | ICD-10-CM | POA: Diagnosis not present

## 2016-05-17 DIAGNOSIS — R1312 Dysphagia, oropharyngeal phase: Secondary | ICD-10-CM | POA: Diagnosis not present

## 2016-05-17 DIAGNOSIS — L299 Pruritus, unspecified: Secondary | ICD-10-CM | POA: Diagnosis not present

## 2016-05-17 DIAGNOSIS — M6281 Muscle weakness (generalized): Secondary | ICD-10-CM | POA: Diagnosis not present

## 2016-05-17 DIAGNOSIS — F0390 Unspecified dementia without behavioral disturbance: Secondary | ICD-10-CM | POA: Diagnosis not present

## 2016-05-17 DIAGNOSIS — E1159 Type 2 diabetes mellitus with other circulatory complications: Secondary | ICD-10-CM | POA: Diagnosis not present

## 2016-05-17 DIAGNOSIS — R4701 Aphasia: Secondary | ICD-10-CM | POA: Diagnosis not present

## 2016-05-17 DIAGNOSIS — R2689 Other abnormalities of gait and mobility: Secondary | ICD-10-CM | POA: Diagnosis not present

## 2016-05-17 DIAGNOSIS — E119 Type 2 diabetes mellitus without complications: Secondary | ICD-10-CM | POA: Diagnosis not present

## 2016-05-17 DIAGNOSIS — I639 Cerebral infarction, unspecified: Secondary | ICD-10-CM | POA: Diagnosis not present

## 2016-05-17 LAB — BASIC METABOLIC PANEL
Anion gap: 8 (ref 5–15)
BUN: 17 mg/dL (ref 6–20)
CHLORIDE: 105 mmol/L (ref 101–111)
CO2: 25 mmol/L (ref 22–32)
CREATININE: 0.62 mg/dL (ref 0.44–1.00)
Calcium: 8.8 mg/dL — ABNORMAL LOW (ref 8.9–10.3)
GFR calc Af Amer: >60 mL/min (ref >60)
GFR calc non Af Amer: >60 mL/min (ref >60)
GLUCOSE: 207 mg/dL — AB (ref 65–99)
Potassium: 3.7 mmol/L (ref 3.5–5.1)
Sodium: 138 mmol/L (ref 135–145)

## 2016-05-17 LAB — CBC
HEMATOCRIT: 43.5 % (ref 35.0–47.0)
Hemoglobin: 14.6 g/dL (ref 12.0–16.0)
MCH: 30.5 pg (ref 26.0–34.0)
MCHC: 33.5 g/dL (ref 32.0–36.0)
MCV: 90.9 fL (ref 80.0–100.0)
Platelets: 267 10*3/uL (ref 150–440)
RBC: 4.78 MIL/uL (ref 3.80–5.20)
RDW: 13.9 % (ref 11.5–14.5)
WBC: 9.2 10*3/uL (ref 3.6–11.0)

## 2016-05-17 LAB — HEMOGLOBIN A1C
Hgb A1c MFr Bld: 9.6 % — ABNORMAL HIGH (ref 4.8–5.6)
Mean Plasma Glucose: 229 mg/dL

## 2016-05-17 LAB — MAGNESIUM: Magnesium: 2 mg/dL (ref 1.7–2.4)

## 2016-05-17 LAB — TROPONIN I: Troponin I: 0.03 ng/mL (ref <0.03)

## 2016-05-17 LAB — GLUCOSE, CAPILLARY
GLUCOSE-CAPILLARY: 234 mg/dL — AB (ref 65–99)
Glucose-Capillary: 240 mg/dL — ABNORMAL HIGH (ref 65–99)
Glucose-Capillary: 153 mg/dL — ABNORMAL HIGH (ref 65–99)

## 2016-05-17 MED ORDER — INSULIN ASPART 100 UNIT/ML ~~LOC~~ SOLN: 0.0000 [IU] | Freq: Every day | SUBCUTANEOUS | Status: DC

## 2016-05-17 MED ORDER — SENNOSIDES-DOCUSATE SODIUM 8.6-50 MG PO TABS: 1.0000 | ORAL_TABLET | Freq: Every evening | ORAL | Status: AC | PRN

## 2016-05-17 MED ORDER — ACETAMINOPHEN 325 MG PO TABS: 650.0000 mg | ORAL_TABLET | Freq: Four times a day (QID) | ORAL | Status: AC | PRN

## 2016-05-17 MED ORDER — ATORVASTATIN CALCIUM 40 MG PO TABS: 40.0000 mg | ORAL_TABLET | Freq: Every day | ORAL | 0 refills | Status: AC

## 2016-05-17 MED ORDER — INSULIN ASPART 100 UNIT/ML ~~LOC~~ SOLN
0.0000 [IU] | Freq: Three times a day (TID) | SUBCUTANEOUS | Status: DC
Start: 2016-05-17 — End: 2016-05-17
  Administered 2016-05-17: 5 [IU] via SUBCUTANEOUS
  Administered 2016-05-17: 3 [IU] via SUBCUTANEOUS
  Administered 2016-05-17: 5 [IU] via SUBCUTANEOUS
  Filled 2016-05-17: qty 5
  Filled 2016-05-17: qty 3
  Filled 2016-05-17: qty 5

## 2016-05-17 MED ORDER — ASPIRIN 81 MG PO TBEC: 81.0000 mg | DELAYED_RELEASE_TABLET | Freq: Every day | ORAL | Status: AC

## 2016-05-17 NOTE — Progress Notes (Signed)
Received MD order to discharge patient [facility] room 206-A called and gave report tp Lauris ChromanBlanche Green LPN, patient transported [facility] by daughter

## 2016-05-17 NOTE — Plan of Care (Signed)
Problem: Education: Goal: Knowledge of disease or condition will improve Outcome: Progressing Per family Goal: Knowledge of patient specific risk factors addressed and post discharge goals established will improve Outcome: Progressing Per family  Problem: Education: Goal: Knowledge of secondary prevention will improve Outcome: Progressing Per family Goal: Knowledge of patient specific risk factors addressed and post discharge goals established will improve Outcome: Progressing Per family  Problem: Coping: Goal: Ability to verbalize positive feelings about self will improve Outcome: Not Applicable Date Met: 32/76/14 Pt with baseline dementia  Goal: Ability to identify appropriate support needs will improve Outcome: Progressing Per family  Problem: Health Behavior/Discharge Planning: Goal: Ability to manage health-related needs will improve Outcome: Progressing Per family  Problem: Self-Care: Goal: Ability to participate in self-care as condition permits will improve Outcome: Not Applicable Date Met: 70/92/95 Baseline dementia Goal: Verbalization of feelings and concerns over difficulty with self-care will improve Outcome: Not Applicable Date Met: 74/73/40 Baseline dementia Goal: Ability to communicate needs accurately will improve Outcome: Not Applicable Date Met: 37/09/64 Baseline dementia

## 2016-05-17 NOTE — NC FL2 (Signed)
[place] MEDICAID FL2 LEVEL OF CARE SCREENING TOOL     IDENTIFICATION  Patient Name: Rose Jones Birthdate: May 27, 1926 Sex: female Admission Date (Current Location): 05/15/2016  [place]   Facility and Address:  [facility], [address], [place], [place] [address]      Provider Number: [id number]  Attending Physician Name and Address:  Ramonita LabAruna Inez Stantz, MD  Relative Name and Phone Number:       Current Level of Care: Hospital Recommended Level of Care: Skilled Nursing Facility Prior Approval Number:    Date Approved/Denied:   PASRR Number:  [id number])  Discharge Plan: SNF    Current Diagnoses: Patient Active Problem List   Diagnosis Date Noted  . CVA (cerebral vascular accident) (HCC) 05/15/2016    Orientation RESPIRATION BLADDER Height & Weight     Self  Normal Incontinent Weight: 139 lb 9.6 oz (63.3 kg) Height:  5\' 2"  (157.5 cm)  BEHAVIORAL SYMPTOMS/MOOD NEUROLOGICAL BOWEL NUTRITION STATUS   (none)  (none) Continent Diet (Diet: Carb Modified )  AMBULATORY STATUS COMMUNICATION OF NEEDS Skin   Limited Assist Verbally Normal                       Personal Care Assistance Level of Assistance  Bathing, Feeding, Dressing Bathing Assistance: Limited assistance Feeding assistance: Independent Dressing Assistance: Limited assistance     Functional Limitations Info  Sight, Hearing, Speech Sight Info: Adequate Hearing Info: Adequate Speech Info: Adequate    SPECIAL CARE FACTORS FREQUENCY  PT (By licensed PT), OT (By licensed OT)     PT Frequency:  (5) OT Frequency:  (5)            Contractures      Additional Factors Info  Code Status, Allergies, Insulin Sliding Scale Code Status Info:  (DNR ) Allergies Info:  (Estrogens)   Insulin Sliding Scale Info:  (NovoLog Insulin Injections )       Current Medications (05/17/2016):  This is the current hospital active medication  list Current Facility-Administered Medications  Medication Dose Route Frequency Provider Last Rate Last Dose  . acetaminophen (TYLENOL) tablet 650 mg  650 mg Oral Q4H PRN Alexis Hugelmeyer, DO       Or  . acetaminophen (TYLENOL) solution 650 mg  650 mg Per Tube Q4H PRN Alexis Hugelmeyer, DO       Or  . acetaminophen (TYLENOL) suppository 650 mg  650 mg Rectal Q4H PRN Alexis Hugelmeyer, DO      . aspirin EC tablet 81 mg  81 mg Oral Daily Ramonita LabAruna Ibrahem Volkman, MD   81 mg at 05/17/16 0835  . atorvastatin (LIPITOR) tablet 40 mg  40 mg Oral q1800 Ramonita LabAruna Cheris Tweten, MD   40 mg at 05/16/16 1804  . enoxaparin (LOVENOX) injection 40 mg  40 mg Subcutaneous Q24H Alexis Hugelmeyer, DO   40 mg at 05/16/16 2037  . insulin aspart (novoLOG) injection 0-15 Units  0-15 Units Subcutaneous TID WC Ramonita LabAruna Yuna Pizzolato, MD   5 Units at 05/17/16 0834  . insulin aspart (novoLOG) injection 0-5 Units  0-5 Units Subcutaneous QHS Deanna ArtisAruna Jasiri Hanawalt, MD      . senna-docusate (Senokot-S) tablet 1 tablet  1 tablet Oral QHS PRN Alexis Hugelmeyer, DO      . sertraline (ZOLOFT) tablet 25 mg  25 mg Oral Daily Ramonita LabAruna Shoua Ressler, MD   25 mg at 05/17/16 [id number]     Discharge Medications: Please see discharge summary for a list of discharge  medications.  Relevant Imaging Results:  Relevant Lab Results:   Additional Information  (SSN: [id number])  Sample, Darleen CrockerBailey M, LCSW

## 2016-05-17 NOTE — Discharge Summary (Signed)
[facility] [place] at [facility]   PATIENT NAME: Rose Jones    MR#:  [id number]  DATE OF BIRTH:  01-27-1926  DATE OF ADMISSION:  05/15/2016 ADMITTING PHYSICIAN: Tonye Royalty, DO  DATE OF DISCHARGE: 05/17/16 PRIMARY CARE PHYSICIAN: Barbette Reichmann, MD    ADMISSION DIAGNOSIS:  Basal ganglia infarction (HCC) [I63.9] Expressive aphasia [R47.01]  DISCHARGE DIAGNOSIS:  Active Problems:   CVA (cerebral vascular accident) (HCC)   SECONDARY DIAGNOSIS:   Past Medical History:  Diagnosis Date  . Dementia   . Diabetes mellitus without complication (HCC)   . History of B-cell lymphoma   . Hypertension     HOSPITAL COURSE:  HPI: Rose Jones is a 82 y.o. female with a known history of diabetes, hypertension, dementia, B-cell lymphoma presents to the emergency department for evaluation of status.  Patient was in a usual state of health until 2 days ago when she experienced a sudden onset of mental status change. Patient's family stated that she was less interactive, very tired and not acting like herself after a long day out with family. Last time she was seen in her usual state of health was 12:15 around 7 PM. Since then she has been less communicative, not interested in her regular activities and sleeping more than usual. Patient's daughter and daughter-in-law indicates that she will occasionally respond to them with single word answers or shakes her head yes and no but most of the time she just looks at them with a blank stare. Today she was unable to ambulate without assistance which is far from her baseline where she is functionally independent.  Patient's daughter indicated that she has a brain MRI scheduled through her primary care provider because of a worsening of her dementia over the past 3 months..Please review history and physical for details  1. CVA of the left basal ganglia,  CT read as subacute versus chronic however given timeline most  likely to have occurred in the last 48 hours. - - Studies: MRA/MRI, with the acute infarct in the left basal ganglia Ech 50% ejection fraction,  -Carotids no significant stenosis - Labs: Lipids,-LDL 110 - Nursing: Neurochecks monitor during the hospital course  - Fluids: IVNS@75cc /hr provided for supportive treatment.  Patient is started on aspirin 81 mg enteric coated and Lipitor 40 mg once daily Appreciate neurology recommendations -PT is recommending home health PT and supervision. Ambulation with the rolling walker with 5 inch wheels. Family is considering skilled nursing care - Routine DVT Px: with Lovenox, SCDs, early ambulation  2. History of diabetes-cover with regular insulin sliding scale. Hold glipizide and metformin  3.History of hypertension-hold Norvasc for permissive hypertension 4. History of dementia/depression- Zoloft  5. Hypomagnesemia and hypokalemia replete and check labs in a.m.  Follow-up with case management and social worker regarding placement  DISCHARGE CONDITIONS:   Stable  CONSULTS OBTAINED:  Treatment Team:  Pauletta Browns, MD   PROCEDURES none  DRUG ALLERGIES:   Allergies  Allergen Reactions  . Estrogens Other (See Comments)    Unknown     DISCHARGE MEDICATIONS:   Current Discharge Medication List    START taking these medications   Details  acetaminophen (TYLENOL) 325 MG tablet Take 2 tablets (650 mg total) by mouth every 6 (six) hours as needed for mild pain (or temp > 37.5 C (99.5 F)).    aspirin EC 81 MG EC tablet Take 1 tablet (81 mg total) by mouth daily.    atorvastatin (LIPITOR) 40 MG tablet Take  1 tablet (40 mg total) by mouth daily at 6 PM. Qty: 30 tablet, Refills: 0    senna-docusate (SENOKOT-S) 8.6-50 MG tablet Take 1 tablet by mouth at bedtime as needed for mild constipation or moderate constipation.      CONTINUE these medications which have NOT CHANGED   Details  amLODipine (NORVASC) 5 MG tablet Take 1  tablet by mouth daily.    Calcium Carbonate-Vitamin D (CALTRATE 600+D PO) Take 1 tablet by mouth daily.    glipiZIDE (GLUCOTROL) 10 MG tablet Take 1 tablet by mouth daily. 5mg  at night and 10mg  in the morning    metFORMIN (GLUCOPHAGE) 500 MG tablet Take 1 tablet by mouth 2 (two) times daily.    sertraline (ZOLOFT) 25 MG tablet Take 1 tablet by mouth daily.    vitamin B-12 (CYANOCOBALAMIN) 1000 MCG tablet Take 1 tablet by mouth daily.    Vitamin D, Ergocalciferol, (DRISDOL) 50000 units CAPS capsule Take 1 capsule by mouth once a week.         DISCHARGE INSTRUCTIONS:   Follow-up with primary care physician at the facility in 2-3 days Follow-up with neurology in a month  DIET:  Cardiac diet  DISCHARGE CONDITION:  Stable  ACTIVITY:  Activity as tolerated per PT  OXYGEN:  Home Oxygen: No.   Oxygen Delivery: room air  DISCHARGE LOCATION:  nursing home   If you experience worsening of your admission symptoms, develop shortness of breath, life threatening emergency, suicidal or homicidal thoughts you must seek medical attention immediately by calling 911 or calling your MD immediately  if symptoms less severe.  You Must read complete instructions/literature along with all the possible adverse reactions/side effects for all the Medicines you take and that have been prescribed to you. Take any new Medicines after you have completely understood and accpet all the possible adverse reactions/side effects.   Please note  You were cared for by a hospitalist during your hospital stay. If you have any questions about your discharge medications or the care you received while you were in the hospital after you are discharged, you can call the unit and asked to speak with the hospitalist on call if the hospitalist that took care of you is not available. Once you are discharged, your primary care physician will handle any further medical issues. Please note that NO REFILLS for any discharge  medications will be authorized once you are discharged, as it is imperative that you return to your primary care physician (or establish a relationship with a primary care physician if you do not have one) for your aftercare needs so that they can reassess your need for medications and monitor your lab values.     Today  Chief Complaint  Patient presents with  . Altered Mental Status   Patient to speech is still slowly. Family at bedside prefers rehabilitation placement  ROS:  CONSTITUTIONAL: Denies fevers, chills. Denies any fatigue, weakness.  EYES: Denies blurry vision, double vision, eye pain. EARS, NOSE, THROAT: Denies tinnitus, ear pain, hearing loss. RESPIRATORY: Denies cough, wheeze, shortness of breath.  CARDIOVASCULAR: Denies chest pain, palpitations, edema.  GASTROINTESTINAL: Denies nausea, vomiting, diarrhea, abdominal pain. Denies bright red blood per rectum. GENITOURINARY: Denies dysuria, hematuria. ENDOCRINE: Denies nocturia or thyroid problems. HEMATOLOGIC AND LYMPHATIC: Denies easy bruising or bleeding. SKIN: Denies rash or lesion. MUSCULOSKELETAL: Denies pain in neck, back, shoulder, knees, hips or arthritic symptoms.  NEUROLOGIC: Dysarthria Denies paralysis, paresthesias.  PSYCHIATRIC: Denies anxiety or depressive symptoms.   VITAL SIGNS:  Blood pressure 139/71, pulse 72, temperature 97.9 F (36.6 C), temperature source Oral, resp. rate 16, height 5\' 2"  (1.575 m), weight 63.3 kg (139 lb 9.6 oz), SpO2 92 %.  I/O:    Intake/Output Summary (Last 24 hours) at 05/17/16 1519 Last data filed at 05/17/16 1300  Gross per 24 hour  Intake              360 ml  Output                0 ml  Net              360 ml    PHYSICAL EXAMINATION:  GENERAL:  82 y.o.-year-old patient lying in the bed with no acute distress.  EYES: Pupils equal, round, reactive to light and accommodation. No scleral icterus. Extraocular muscles intact.  HEENT: Head atraumatic, normocephalic.  Oropharynx and nasopharynx clear.  NECK:  Supple, no jugular venous distention. No thyroid enlargement, no tenderness.  LUNGS: Normal breath sounds bilaterally, no wheezing, rales,rhonchi or crepitation. No use of accessory muscles of respiration.  CARDIOVASCULAR: S1, S2 normal. No murmurs, rubs, or gallops.  ABDOMEN: Soft, non-tender, non-distended. Bowel sounds present. No organomegaly or mass.  EXTREMITIES: No pedal edema, cyanosis, or clubbing.  NEUROLOGIC: Cranial nerves II through XII are intact. Muscle strength 4/5 in Right upper extremity and lower extremity and 5 out of 5 in left upper and lower extremity  Sensation intact. Gait not checked.  PSYCHIATRIC: The patient is alert and oriented x 3.  SKIN: No obvious rash, lesion, or ulcer.   DATA REVIEW:   CBC  Recent Labs Lab 05/17/16 0450  WBC 9.2  HGB 14.6  HCT 43.5  PLT 267    Chemistries   Recent Labs Lab 05/15/16 1607 05/17/16 0450  NA 138 138  K 3.1* 3.7  CL 100* 105  CO2 28 25  GLUCOSE 175* 207*  BUN 17 17  CREATININE 0.66 0.62  CALCIUM 10.0 8.8*  MG 1.6* 2.0  AST 28  --   ALT 19  --   ALKPHOS 85  --   BILITOT 0.5  --     Cardiac Enzymes  Recent Labs Lab 05/17/16 1015  TROPONINI 0.03*    Microbiology Results  No results found for this or any previous visit.  RADIOLOGY:  Ct Head Wo Contrast  Result Date: 05/15/2016 CLINICAL DATA:  Per family pt woke up this am not acting right. Pt normally very active around the house, today pt had confusion and is not her normal self. Pt with hx of seizures 30+ years ago and hx of B-cell ca. EXAM: CT HEAD WITHOUT CONTRAST TECHNIQUE: Contiguous axial images were obtained from the base of the skull through the vertex without intravenous contrast. COMPARISON:  MRI of the brain on 03/10/2006 FINDINGS: Brain: There is focal low-attenuation within the left caudate nucleus, internal capsule, globus pallidus, and putamen, consistent with probable subacute or chronic  infarct. No associated hemorrhage. The findings are new since the prior MRI. There is moderate atrophy. Significant periventricular white matter changes are consistent with small vessel disease. There is no intra or extra-axial fluid collection or mass lesion. The basilar cisterns and ventricles have a normal appearance. There is no CT evidence for acute infarction or hemorrhage. Vascular: There is atherosclerotic calcification of the carotid siphons. Skull: Normal. Negative for fracture or focal lesion. Sinuses/Orbits: There is moderate mucoperiosteal thickening involving the ethmoid and sphenoid air cells. Orbits are unremarkable in appearance. Mastoid air cells are  normally aerated. Other: None IMPRESSION: 1. Interval infarct of the left basal ganglia not associated with hemorrhage. The appearance favors a subacute or chronic process. 2. Atrophy and small vessel disease. 3. Moderate paranasal sinus disease. Critical Value/emergent results were called by telephone at the time of interpretation on 05/15/2016 at 5:49 pm to Dr. Roxan Hockeyobinson, who verbally acknowledged these results. Electronically Signed   By: Norva PavlovElizabeth  Brown M.D.   On: 05/15/2016 17:50   Mr Brain Wo Contrast  Addendum Date: 05/16/2016   ADDENDUM REPORT: 05/16/2016 12:25 ADDENDUM: Study discussed by telephone with Dr. Cheron SchaumannGouro on 05/16/2016 At 1222 hours. Electronically Signed   By: Odessa FlemingH  Hall M.D.   On: 05/16/2016 12:25   Result Date: 05/16/2016 CLINICAL DATA:  82 year old female with abrupt onset mental status changes 2 days prior to presentation. Initial encounter. EXAM: MRI HEAD WITHOUT CONTRAST MRA HEAD WITHOUT CONTRAST TECHNIQUE: Multiplanar, multiecho pulse sequences of the brain and surrounding structures were obtained without intravenous contrast. Angiographic images of the head were obtained using MRA technique without contrast. COMPARISON:  Head CT without contrast 05/15/2016. Brain MRI 03/10/2006. FINDINGS: MRI HEAD FINDINGS Brain:  Confluent 3 cm area of restricted diffusion involving much of the left basal ganglia, and a portion of the medial left corona radiata. This corresponds to the recent CT finding. Associated T2 and FLAIR hyperintensity. Evidence of associated petechial hemorrhage on series 14, image 16, but no mass effect. No other restricted diffusion. Since 2007 progressed bilateral mostly periventricular cerebral white matter T2 and FLAIR hyperintensity, now patchy and confluent in areas. No cortical encephalomalacia identified. No other cerebral blood products identified. The right basal ganglia, thalami, brainstem, and cerebellum remain normal. No midline shift, mass effect, evidence of mass lesion, ventriculomegaly, extra-axial collection. Cervicomedullary junction and pituitary are within normal limits. Vascular: Major intracranial vascular flow voids are stable since 2007. Skull and upper cervical spine: Negative. Visualized bone marrow signal is within normal limits. Sinuses/Orbits: Stable and negative orbits soft tissues. Mild to moderate paranasal sinus mucosal thickening has progressed, maximal in the anterior ethmoids and frontal sinuses. Other: Trace right mastoid effusion. Trace retained secretions in the nasopharynx. Negative scalp soft tissues. MRA HEAD FINDINGS Antegrade flow in the posterior circulation with fairly codominant distal vertebral arteries. Artifactual signal loss in the proximal V4 segments. No distal vertebral stenosis. Negative basilar artery. SCA and PCA origins are normal. Posterior communicating arteries are diminutive or absent. Normal PCA branches. Antegrade flow in both ICA siphons. Tortuous distal cervical ICAs. No siphon stenosis. Normal ophthalmic artery origins. Patent carotid termini. Normal MCA and ACA origins. Left A1 segment appears dominant. Anterior communicating artery and visualized ACA branches are within normal limits. Right MCA M1 segment, bifurcation, and visible right MCA  branches are within normal limits. Left MCA M1 segment is within normal limits. Left MCA bifurcation is patent. No left MCA branch occlusion identified. IMPRESSION: 1. Confluent acute infarct in the left basal ganglia corresponding to the recent CT finding. Petechial hemorrhage but no associated mass effect. 2. No other acute intracranial abnormality. Mild for age nonspecific cerebral white matter changes with progression since 2007. 3.  Negative intracranial MRA. Electronically Signed: By: Odessa FlemingH  Hall M.D. On: 05/16/2016 10:40   [place] Carotid Bilateral (at [facility] And [facility] Only)  Result Date: 05/16/2016 CLINICAL DATA:  CVA. EXAM: BILATERAL CAROTID DUPLEX ULTRASOUND TECHNIQUE: Wallace CullensGray scale imaging, color Doppler and duplex ultrasound were performed of bilateral carotid and vertebral arteries in the neck. COMPARISON:  MRI 05/16/2016 . FINDINGS: Criteria: Quantification of carotid stenosis  is based on velocity parameters that correlate the residual internal carotid diameter with NASCET-based stenosis levels, using the diameter of the distal internal carotid lumen as the denominator for stenosis measurement. The following velocity measurements were obtained: RIGHT ICA:  51/16 cm/sec CCA:  53/6 cm/sec SYSTOLIC ICA/CCA RATIO:  1.0 DIASTOLIC ICA/CCA RATIO:  2.5 ECA:  63 cm/sec LEFT ICA:  46/7 cm/sec CCA:  53/8 cm/sec SYSTOLIC ICA/CCA RATIO:  0.9 DIASTOLIC ICA/CCA RATIO:  0.9 ECA:  50 cm/sec RIGHT CAROTID ARTERY: Mild right carotid bifurcation punctate plaque. No flow limiting stenosis. RIGHT VERTEBRAL ARTERY:  Patent with antegrade flow. LEFT CAROTID ARTERY: No significant carotid atherosclerotic vascular disease. LEFT VERTEBRAL ARTERY:  Patent with antegrade flow. IMPRESSION: 1. Mild right carotid bifurcation punctate plaque. No flow limiting stenosis. Degree of stenosis less than 50%. 2.  No significant left carotid atherosclerotic-disease. 3. Vertebral arteries are patent antegrade flow. Electronically Signed   By: Maisie Fus   Register   On: 05/16/2016 11:38   Mr Maxine Glenn Head/brain Wo Cm  Addendum Date: 05/16/2016   ADDENDUM REPORT: 05/16/2016 12:25 ADDENDUM: Study discussed by telephone with Dr. Cheron Schaumann on 05/16/2016 At 1222 hours. Electronically Signed   By: Odessa Fleming M.D.   On: 05/16/2016 12:25   Result Date: 05/16/2016 CLINICAL DATA:  82 year old female with abrupt onset mental status changes 2 days prior to presentation. Initial encounter. EXAM: MRI HEAD WITHOUT CONTRAST MRA HEAD WITHOUT CONTRAST TECHNIQUE: Multiplanar, multiecho pulse sequences of the brain and surrounding structures were obtained without intravenous contrast. Angiographic images of the head were obtained using MRA technique without contrast. COMPARISON:  Head CT without contrast 05/15/2016. Brain MRI 03/10/2006. FINDINGS: MRI HEAD FINDINGS Brain: Confluent 3 cm area of restricted diffusion involving much of the left basal ganglia, and a portion of the medial left corona radiata. This corresponds to the recent CT finding. Associated T2 and FLAIR hyperintensity. Evidence of associated petechial hemorrhage on series 14, image 16, but no mass effect. No other restricted diffusion. Since 2007 progressed bilateral mostly periventricular cerebral white matter T2 and FLAIR hyperintensity, now patchy and confluent in areas. No cortical encephalomalacia identified. No other cerebral blood products identified. The right basal ganglia, thalami, brainstem, and cerebellum remain normal. No midline shift, mass effect, evidence of mass lesion, ventriculomegaly, extra-axial collection. Cervicomedullary junction and pituitary are within normal limits. Vascular: Major intracranial vascular flow voids are stable since 2007. Skull and upper cervical spine: Negative. Visualized bone marrow signal is within normal limits. Sinuses/Orbits: Stable and negative orbits soft tissues. Mild to moderate paranasal sinus mucosal thickening has progressed, maximal in the anterior ethmoids and  frontal sinuses. Other: Trace right mastoid effusion. Trace retained secretions in the nasopharynx. Negative scalp soft tissues. MRA HEAD FINDINGS Antegrade flow in the posterior circulation with fairly codominant distal vertebral arteries. Artifactual signal loss in the proximal V4 segments. No distal vertebral stenosis. Negative basilar artery. SCA and PCA origins are normal. Posterior communicating arteries are diminutive or absent. Normal PCA branches. Antegrade flow in both ICA siphons. Tortuous distal cervical ICAs. No siphon stenosis. Normal ophthalmic artery origins. Patent carotid termini. Normal MCA and ACA origins. Left A1 segment appears dominant. Anterior communicating artery and visualized ACA branches are within normal limits. Right MCA M1 segment, bifurcation, and visible right MCA branches are within normal limits. Left MCA M1 segment is within normal limits. Left MCA bifurcation is patent. No left MCA branch occlusion identified. IMPRESSION: 1. Confluent acute infarct in the left basal ganglia corresponding to the recent CT finding. Petechial hemorrhage  but no associated mass effect. 2. No other acute intracranial abnormality. Mild for age nonspecific cerebral white matter changes with progression since 2007. 3.  Negative intracranial MRA. Electronically Signed: By: Odessa FlemingH  Hall M.D. On: 05/16/2016 10:40    EKG:   Orders placed or performed during the hospital encounter of 05/15/16  . EKG 12-Lead  . EKG 12-Lead      Management plans discussed with the patient, family and they are in agreement.  CODE STATUS:     Code Status Orders        Start     Ordered   05/16/16 1708  Do not attempt resuscitation (DNR)  Continuous    Question Answer Comment  In the event of cardiac or respiratory ARREST Do not call a "code blue"   In the event of cardiac or respiratory ARREST Do not perform Intubation, CPR, defibrillation or ACLS   In the event of cardiac or respiratory ARREST Use medication  by any route, position, wound care, and other measures to relive pain and suffering. May use oxygen, suction and manual treatment of airway obstruction as needed for comfort.   Comments RN MAY PRONOUNCE      05/16/16 1707    Code Status History    Date Active Date Inactive Code Status Order ID Comments User Context   05/15/2016 10:11 PM 05/16/2016  5:07 PM Full Code [id number]  Tonye RoyaltyAlexis Hugelmeyer, DO Inpatient    Advance Directive Documentation   Flowsheet Row Most Recent Value  Type of Advance Directive  Healthcare Power of Attorney  Pre-existing out of facility DNR order (yellow form or pink MOST form)  No data  "MOST" Form in Place?  No data      TOTAL TIME TAKING CARE OF THIS PATIENT: 45 minutes.   Note: This dictation was prepared with Dragon dictation along with smaller phrase technology. Any transcriptional errors that result from this process are unintentional.   @MEC @  on 05/17/2016 at 3:19 PM  Between 7am to 6pm - Pager - [phone]  After 6pm go to [email or web address] - password [email or web address]  [facility]  Office  [phone]  CC: Primary care physician; Barbette ReichmannHANDE,VISHWANATH, MD

## 2016-05-17 NOTE — Progress Notes (Signed)
Physical Therapy Treatment Patient Details Name: Rose Jones MRN: [id number] DOB: [birth year] Today's Date: 05/17/2016    History of Present Illness 82 yo female with L basal ganglia infarct, AMS, has chronic brain changes with dementia.  PMHx:  b cell lymphoma, HTN, DM,       PT Comments    Pt is making good progress towards goals with improved ambulation distance this date. Still needs cues for use of RW as she tends to deviate towards R side. Will needs hands on assist for fall prevention during ambulation. Recommend continued supervision at home for safety. Improving strength on R side, however decreased endurance noted. Able to follow commands for there-ex.  Follow Up Recommendations  Home health PT;Supervision/Assistance - 24 hour     Equipment Recommendations  Rolling walker with 5" wheels    Recommendations for Other Services       Precautions / Restrictions Precautions Precautions: Fall Precaution Comments: was independent with no AD previously Restrictions Weight Bearing Restrictions: No    Mobility  Bed Mobility Overal bed mobility: Needs Assistance Bed Mobility: Supine to Sit;Sit to Supine     Supine to sit: Min guard     General bed mobility comments: cues for sequencing given with pt using rails. Safe technique performed with slow performance. Once seated at EOB, pt able to sit with supervision  Transfers Overall transfer level: Needs assistance Equipment used: Rolling walker (2 wheeled) Transfers: Sit to/from Stand Sit to Stand: Min assist         General transfer comment: Pt with slight posterior sway once standing, needs cues for holding onto RW and leaning with anterior weight shift. Able to follow commands.  Ambulation/Gait Ambulation/Gait assistance: Min guard Ambulation Distance (Feet): 100 Feet Assistive device: Rolling walker (2 wheeled) Gait Pattern/deviations: Step-through pattern     General Gait Details: short step length noted  with pt tending to drift to R, needs cues to keep body aligned in center of RW. Slow gait speed noted. Needs cues for safe turns.   Stairs            Wheelchair Mobility    Modified Rankin (Stroke Patients Only)       Balance                                    Cognition Arousal/Alertness: Awake/alert Behavior During Therapy: Flat affect Overall Cognitive Status: History of cognitive impairments - at baseline                      Exercises Other Exercises Other Exercises: Supine ther-ex performed on B LE including ankle pumps, SLRs, heel slides, and hip abd/add. All ther-ex performed x 10 reps. Pt fatigues quickly on R LE only able to perform 5 reps prior to fatigue and then needs min assist for continuing reps. CGA for L LE. Cues for correct technique    General Comments        Pertinent Vitals/Pain Pain Assessment: No/denies pain    Home Living                      Prior Function            PT Goals (current goals can now be found in the care plan section) Acute Rehab PT Goals Patient Stated Goal: none stated PT Goal Formulation: With patient/family Time For Goal Achievement: 05/30/16 Potential to Achieve  Goals: Good Progress towards PT goals: Progressing toward goals    Frequency    7X/week      PT Plan Current plan remains appropriate    Co-evaluation             End of Session Equipment Utilized During Treatment: Gait belt Activity Tolerance: Patient tolerated treatment well;Patient limited by fatigue Patient left: in chair;with chair alarm set;with family/visitor present     Time: [id number] PT Time Calculation (min) (ACUTE ONLY): 25 min  Charges:  $Gait Training: 8-22 mins $Therapeutic Exercise: 8-22 mins                    G Codes:      Haevyn Ury 05/17/2016, 10:24 AM  Elizabeth PalauStephanie Jalana Moore, PT, DPT [phone]

## 2016-05-17 NOTE — Clinical Social Work Note (Signed)
Clinical Social Work Assessment  Patient Details  Name: Rose Jones MRN: [id number] Date of Birth: 05-Sep-1925  Date of referral:  05/17/16               Reason for consult:  Facility Placement                Permission sought to share information with:  Oceanographeracility Contact Representative Permission granted to share information::  Yes, Verbal Permission Granted  Name::        Agency::     Relationship::     Contact Information:     Housing/Transportation Living arrangements for the past 2 months:  Single Family Home Source of Information:  Patient, Adult Children Patient Interpreter Needed:  None Criminal Activity/Legal Involvement Pertinent to Current Situation/Hospitalization:  No - Comment as needed Significant Relationships:  Adult Children Lives with:  Adult Children Do you feel safe going back to the place where you live?  Yes Need for family participation in patient care:  Yes (Comment)  Care giving concerns:  Patient lives between the homes of her 3 adult children. Patient's 2 daughters live in [place] and 1 son lives in [place].    Social Worker assessment / plan:  Visual merchandiserClinical Social Worker (CSW) received verbal consult from RN case manager patient's family is requesting SNF. CSW contacted patient's daughter Rose Jones to discuss D/C plan. Per daughter patient does not have 24/7 care because all her adult children work during the day. Daughter reported that patient and family would like for patient to go to [facility] SN[facility] in [place], [place] because it is close to her 2 daughters. Rose [facility] reported that they can accept patient today and they are in [facility]   Patient is medically stable for D/C to [facility] today. Per Rose Boozeasha patient can go to room 206-A. [facility] has been received. Auth # [id number]. RN will call report. CSW sent D/C Summary and FL2 to [facility]. Patient's daughter will transport in  private vehicle.     Employment status:  Retired Database administratornsurance information:  Managed Medicare PT Recommendations:  Home with Home Health, 24 Hour Supervision Information / Referral to community resources:  Skilled Nursing Facility  Patient/Family's Response to care:  Patient and her family are agreeable for patient to go to SNF today.   Patient/Family's Understanding of and Emotional Response to Diagnosis, Current Treatment, and Prognosis:  Patient and her family were very pleasant and thanked CSW for assistance.   Emotional Assessment Appearance:  Appears stated age Attitude/Demeanor/Rapport:    Affect (typically observed):  Accepting, Adaptable, Pleasant Orientation:  Oriented to Self, Oriented to Place, Oriented to  Time, Oriented to Situation Alcohol / Substance use:  Not Applicable Psych involvement (Current and /or in the community):  No (Comment)  Discharge Needs  Concerns to be addressed:  Discharge Planning Concerns Readmission within the last 30 days:  No Current discharge risk:  Dependent with Mobility Barriers to Discharge:  No Barriers Identified   Rose Jones, Rose CrockerBailey M, LCSW 05/17/2016, 4:17 PM

## 2016-05-17 NOTE — Progress Notes (Signed)
Inpatient Diabetes Program Recommendations  AACE/ADA: New Consensus Statement on Inpatient Glycemic Control (2015)  Target Ranges:  Prepandial:   less than 140 mg/dL      Peak postprandial:   less than 180 mg/dL (1-2 hours)      Critically ill patients:  140 - 180 mg/dL   Lab Results  Component Value Date   GLUCAP 240 (H) 05/17/2016   HGBA1C 9.6 (H) 05/16/2016    Review of Glycemic Control:  Results for Shane CrutchMONTEL, Shiann (MRN [id number]) as of 05/17/2016 09:39  Ref. Range 05/16/2016 08:09 05/16/2016 12:00 05/16/2016 16:59 05/16/2016 20:35 05/17/2016 07:35  Glucose-Capillary Latest Ref Range: 65 - 99 mg/dL 409133 (H) 811171 (H) 914193 (H) 218 (H) 240 (H)    Diabetes history: Type 2 diabetes Outpatient Diabetes medications: Glucotrol 10 mg daily, Metformin 500 mg bid Current orders for Inpatient glycemic control:  Novolog moderate tid with meals and HS Inpatient Diabetes Program Recommendations:    Note morning blood sugar 240 mg/dL. If appropriate, may consider adding Lantus 6 units daily.    Thanks, Beryl MeagerJenny Kolden Dupee, RN, BC-ADM Inpatient Diabetes Coordinator Pager [phone] (8a-5p)

## 2016-05-17 NOTE — Evaluation (Signed)
Occupational Therapy Evaluation Patient Details Name: Rose CrutchBeulah Jones MRN: [id number] DOB: August 13, 1925 Today's Date: 05/17/2016    History of Present Illness Pt. is an 82 y.o. female who was [facility] with a Basal Ganglia Infarct. PMHx includes: dementia, B cell Lymphoma, HTN, DM.     Clinical Impression  Pt. Is an 82 y.o. Female who [facility] with Basal Ganglia Infarct. Pt. Presents with weakness, and decreased functional mobility during ADLs. Pt. Could benefit from skilled OT services for ADL training, there. Ex, there. Activity, and pt. Education about home modification/DME. Pt. Could benefit from follow-up OT services upon discharge.    Follow Up Recommendations  Home health OT    Equipment Recommendations       Recommendations for Other Services       Precautions / Restrictions Precautions Precautions: Fall Precaution Comments: was independent with no AD previously Restrictions Weight Bearing Restrictions: No                                                     ADL Overall ADL's : Needs assistance/impaired Eating/Feeding: Set up (Cues for initiation)   Grooming: Minimal assistance               Lower Body Dressing: Supervision/safety                 General ADL Comments: Pt./son education was provided about self-care.     Vision     Perception     Praxis      Pertinent Vitals/Pain Pain Assessment: No/denies pain     Hand Dominance     Extremity/Trunk Assessment Upper Extremity Assessment Upper Extremity Assessment: Overall WFL for tasks assessed           Communication Communication Communication: Expressive difficulties   Cognition Arousal/Alertness: Awake/alert Behavior During Therapy: Flat affect Overall Cognitive Status: History of cognitive impairments - at baseline                     General Comments       Exercises      Shoulder Instructions      Home Living  Family/patient expects to be discharged to:: Private residence Living Arrangements: Children Available Help at Discharge: Family;Available 24 hours/day Type of Home: House Home Access: Level entry     Home Layout: One level     Bathroom Shower/Tub: Curtain         Home Equipment: Cane - single point          Prior Functioning/Environment Level of Independence: Needs assistance                 OT Problem List: Decreased strength;Pain;Decreased knowledge of use of DME or AE;Decreased activity tolerance;Decreased cognition   OT Treatment/Interventions: Self-care/ADL training;Therapeutic exercise;Patient/family education;DME and/or AE instruction;Neuromuscular education;Therapeutic activities;Cognitive remediation/compensation    OT Goals(Current goals can be found in the care plan section) Acute Rehab OT Goals Patient Stated Goal: To return home OT Goal Formulation: With patient Potential to Achieve Goals: Good  OT Frequency: Min 1X/week   Barriers to D/C:            Co-evaluation              End of Session    Activity Tolerance: Patient tolerated treatment well Patient left: in chair;with call  bell/phone within reach;with chair alarm set   Time: 1125-1145 OT Time Calculation (min): 20 min Charges:  OT General Charges $OT Visit: 1 Procedure OT Evaluation $OT Eval Moderate Complexity: 1 Procedure G-Codes:    Olegario MessierElaine Ashlei Chinchilla, MS, OTR/L 05/17/2016, 12:01 PM

## 2016-05-17 NOTE — Progress Notes (Signed)
Speech Language Pathology Treatment:    Patient Details Name: Rose Jones MRN: [id number] DOB: 07/08/1925 Today's Date: 05/17/2016 Time: 2841-32440959-1014 SLP Time Calculation (min) (ACUTE ONLY): 15 min  Assessment / Plan / Recommendation Clinical Impression  Skilled treatment session focused on dysphagia goals. SLP facilitated session by providing skilled observation of consumption of dysphagia 3 with thin liquids. Pt's son present and very pleased with pt's intake this morning.  Pt required Min A verbal cues for use of aspiration precautions. Education provided on decreasing environmental distractions, slow rate and small bites. Pt without overt s/s of aspiration with current diet.     HPI HPI: Pt is a 82 y.o. female with a known history of diabetes, hypertension, dementia, B-cell lymphoma presents to the emergency department for evaluation of status.  Patient was in a usual state of health until 2 days ago when she experienced a sudden onset of mental status change. Patient's family stated that she was less interactive, very tired and not acting like herself after a long day out with family. Last time she was seen in her usual state of health was 12:15 around 7 PM. Since then she has been less communicative, not interested in her regular activities and sleeping more than usual. Patient's daughter and daughter-in-law indicates that she will occasionally respond to them with single word answers or shakes her head yes and no but most of the time she just looks at them with a blank stare. Currently, she is more alert and able to converse in short phrases and indicate basic wants/needs. Noted chuckling and reduced attention to conversation and task requiring moderate cues.       SLP Plan  Continue with current plan of care     Recommendations  Diet recommendations: Thin liquid;Dysphagia 3 (mechanical soft) Liquids provided via: Cup Medication Administration: Whole meds with puree Supervision: Staff to  assist with self feeding Compensations: Minimize environmental distractions;Slow rate;Small sips/bites;Follow solids with liquid Postural Changes and/or Swallow Maneuvers: Seated upright 90 degrees                Oral Care Recommendations: Oral care BID Follow up Recommendations: None Plan: Continue with current plan of care       GO                Yvette Loveless 05/17/2016, 10:42 AM

## 2016-05-17 NOTE — Care Management (Signed)
Spoke with daughter Dwyane DeeMartha Studebaker [phone]). Family would like patient to go to SNF. Per CSW patient meets criteria after speaking with the insurance company for SNF. CSW will call daughter back. Medically stable for discharge today

## 2016-05-17 NOTE — Clinical Social Work Placement (Signed)
   CLINICAL SOCIAL WORK PLACEMENT  NOTE  Date:  05/17/2016  Patient Details  Name: Rose Jones MRN: [id number] Date of Birth: 12-29-1925  Clinical Social Work is seeking post-discharge placement for this patient at the Skilled  Nursing Facility level of care (*CSW will initial, date and re-position this form in  chart as items are completed):  Yes   Patient/family provided with [place] Clinical Social Work Department's list of facilities offering this level of care within the geographic area requested by the patient (or if unable, by the patient's family).  Yes   Patient/family informed of their freedom to choose among providers that offer the needed level of care, that participate in Medicare, Medicaid or managed care program needed by the patient, have an available bed and are willing to accept the patient.  Yes   Patient/family informed of [facility]'s ownership interest in [facility] and [facility], as well as of the fact that they are under no obligation to receive care at these facilities.  PASRR submitted to EDS on 05/17/16     PASRR number received on 05/17/16     Existing PASRR number confirmed on       FL2 transmitted to all facilities in geographic area requested by pt/family on 05/17/16     FL2 transmitted to all facilities within larger geographic area on       Patient informed that his/her managed care company has contracts with or will negotiate with certain facilities, including the following:        Yes   Patient/family informed of bed offers received.  Patient [facility] bed at  [facility]     Physician recommends and patient chooses bed at      Patient to be transferred to  [facility] on 05/17/16.  Patient to be transferred to facility by  (Patient's daughter [name] will provide transport. )     Patient family notified on 05/17/16 of transfer.  Name of family member notified:   (Patient's daughter [name] is aware of  D/C today. )     PHYSICIAN       Additional Comment:    _______________________________________________ Avacyn Kloosterman, Darleen CrockerBailey M, LCSW 05/17/2016, 4:17 PM

## 2016-05-17 NOTE — Progress Notes (Signed)
Spoke with  MD Gouru about troponin of 0.03 no new orders, per MD ok to be discharged

## 2016-05-19 DIAGNOSIS — E1159 Type 2 diabetes mellitus with other circulatory complications: Secondary | ICD-10-CM | POA: Diagnosis not present

## 2016-05-19 DIAGNOSIS — G301 Alzheimer's disease with late onset: Secondary | ICD-10-CM | POA: Diagnosis not present

## 2016-05-19 DIAGNOSIS — I10 Essential (primary) hypertension: Secondary | ICD-10-CM | POA: Diagnosis not present

## 2016-05-19 DIAGNOSIS — I639 Cerebral infarction, unspecified: Secondary | ICD-10-CM | POA: Diagnosis not present

## 2016-05-25 DIAGNOSIS — L299 Pruritus, unspecified: Secondary | ICD-10-CM | POA: Diagnosis not present

## 2016-05-25 DIAGNOSIS — L309 Dermatitis, unspecified: Secondary | ICD-10-CM | POA: Diagnosis not present

## 2016-05-29 DIAGNOSIS — N390 Urinary tract infection, site not specified: Secondary | ICD-10-CM | POA: Diagnosis not present

## 2016-05-31 DIAGNOSIS — R296 Repeated falls: Secondary | ICD-10-CM | POA: Diagnosis not present

## 2016-05-31 DIAGNOSIS — W19XXXA Unspecified fall, initial encounter: Secondary | ICD-10-CM | POA: Diagnosis not present

## 2016-05-31 DIAGNOSIS — N390 Urinary tract infection, site not specified: Secondary | ICD-10-CM | POA: Diagnosis not present

## 2016-06-01 ENCOUNTER — Ambulatory Visit: Payer: Commercial Managed Care - HMO

## 2016-06-06 DIAGNOSIS — M6281 Muscle weakness (generalized): Secondary | ICD-10-CM | POA: Diagnosis not present

## 2016-06-06 DIAGNOSIS — I6990 Unspecified sequelae of unspecified cerebrovascular disease: Secondary | ICD-10-CM | POA: Diagnosis not present

## 2016-06-06 DIAGNOSIS — F0390 Unspecified dementia without behavioral disturbance: Secondary | ICD-10-CM | POA: Diagnosis not present

## 2016-06-06 DIAGNOSIS — E119 Type 2 diabetes mellitus without complications: Secondary | ICD-10-CM | POA: Diagnosis not present

## 2016-06-10 DIAGNOSIS — F0390 Unspecified dementia without behavioral disturbance: Secondary | ICD-10-CM | POA: Diagnosis not present

## 2016-06-10 DIAGNOSIS — I69351 Hemiplegia and hemiparesis following cerebral infarction affecting right dominant side: Secondary | ICD-10-CM | POA: Diagnosis not present

## 2016-06-10 DIAGNOSIS — Z7982 Long term (current) use of aspirin: Secondary | ICD-10-CM | POA: Diagnosis not present

## 2016-06-10 DIAGNOSIS — I69320 Aphasia following cerebral infarction: Secondary | ICD-10-CM | POA: Diagnosis not present

## 2016-06-10 DIAGNOSIS — E119 Type 2 diabetes mellitus without complications: Secondary | ICD-10-CM | POA: Diagnosis not present

## 2016-06-10 DIAGNOSIS — I10 Essential (primary) hypertension: Secondary | ICD-10-CM | POA: Diagnosis not present

## 2016-06-10 DIAGNOSIS — Z7984 Long term (current) use of oral hypoglycemic drugs: Secondary | ICD-10-CM | POA: Diagnosis not present

## 2016-06-17 DIAGNOSIS — I69351 Hemiplegia and hemiparesis following cerebral infarction affecting right dominant side: Secondary | ICD-10-CM | POA: Diagnosis not present

## 2016-06-17 DIAGNOSIS — I69320 Aphasia following cerebral infarction: Secondary | ICD-10-CM | POA: Diagnosis not present

## 2016-06-17 DIAGNOSIS — Z7984 Long term (current) use of oral hypoglycemic drugs: Secondary | ICD-10-CM | POA: Diagnosis not present

## 2016-06-17 DIAGNOSIS — E119 Type 2 diabetes mellitus without complications: Secondary | ICD-10-CM | POA: Diagnosis not present

## 2016-06-17 DIAGNOSIS — I10 Essential (primary) hypertension: Secondary | ICD-10-CM | POA: Diagnosis not present

## 2016-06-17 DIAGNOSIS — F0390 Unspecified dementia without behavioral disturbance: Secondary | ICD-10-CM | POA: Diagnosis not present

## 2016-06-17 DIAGNOSIS — Z7982 Long term (current) use of aspirin: Secondary | ICD-10-CM | POA: Diagnosis not present

## 2016-06-20 DIAGNOSIS — I10 Essential (primary) hypertension: Secondary | ICD-10-CM | POA: Diagnosis not present

## 2016-06-20 DIAGNOSIS — E119 Type 2 diabetes mellitus without complications: Secondary | ICD-10-CM | POA: Diagnosis not present

## 2016-06-20 DIAGNOSIS — I69351 Hemiplegia and hemiparesis following cerebral infarction affecting right dominant side: Secondary | ICD-10-CM | POA: Diagnosis not present

## 2016-06-20 DIAGNOSIS — F0390 Unspecified dementia without behavioral disturbance: Secondary | ICD-10-CM | POA: Diagnosis not present

## 2016-06-20 DIAGNOSIS — Z7984 Long term (current) use of oral hypoglycemic drugs: Secondary | ICD-10-CM | POA: Diagnosis not present

## 2016-06-20 DIAGNOSIS — I69320 Aphasia following cerebral infarction: Secondary | ICD-10-CM | POA: Diagnosis not present

## 2016-06-20 DIAGNOSIS — Z7982 Long term (current) use of aspirin: Secondary | ICD-10-CM | POA: Diagnosis not present

## 2016-06-21 DIAGNOSIS — I10 Essential (primary) hypertension: Secondary | ICD-10-CM | POA: Diagnosis not present

## 2016-06-21 DIAGNOSIS — Z7982 Long term (current) use of aspirin: Secondary | ICD-10-CM | POA: Diagnosis not present

## 2016-06-21 DIAGNOSIS — Z7984 Long term (current) use of oral hypoglycemic drugs: Secondary | ICD-10-CM | POA: Diagnosis not present

## 2016-06-21 DIAGNOSIS — I69320 Aphasia following cerebral infarction: Secondary | ICD-10-CM | POA: Diagnosis not present

## 2016-06-21 DIAGNOSIS — I69351 Hemiplegia and hemiparesis following cerebral infarction affecting right dominant side: Secondary | ICD-10-CM | POA: Diagnosis not present

## 2016-06-21 DIAGNOSIS — F0390 Unspecified dementia without behavioral disturbance: Secondary | ICD-10-CM | POA: Diagnosis not present

## 2016-06-21 DIAGNOSIS — E119 Type 2 diabetes mellitus without complications: Secondary | ICD-10-CM | POA: Diagnosis not present

## 2016-06-22 ENCOUNTER — Emergency Department: Payer: Medicare Other

## 2016-06-22 ENCOUNTER — Inpatient Hospital Stay
Admission: EM | Admit: 2016-06-22 | Discharge: 2016-06-23 | DRG: 101 | Disposition: A | Discharge status: Home Health | Payer: Medicare Other | Attending: Internal Medicine | Admitting: Internal Medicine

## 2016-06-22 ENCOUNTER — Encounter: Payer: Self-pay | Admitting: Emergency Medicine

## 2016-06-22 DIAGNOSIS — M6281 Muscle weakness (generalized): Secondary | ICD-10-CM

## 2016-06-22 DIAGNOSIS — Z8572 Personal history of non-Hodgkin lymphomas: Secondary | ICD-10-CM

## 2016-06-22 DIAGNOSIS — Z8673 Personal history of transient ischemic attack (TIA), and cerebral infarction without residual deficits: Secondary | ICD-10-CM

## 2016-06-22 DIAGNOSIS — Z79899 Other long term (current) drug therapy: Secondary | ICD-10-CM

## 2016-06-22 DIAGNOSIS — I639 Cerebral infarction, unspecified: Secondary | ICD-10-CM | POA: Diagnosis present

## 2016-06-22 DIAGNOSIS — R4182 Altered mental status, unspecified: Secondary | ICD-10-CM | POA: Diagnosis not present

## 2016-06-22 DIAGNOSIS — I10 Essential (primary) hypertension: Secondary | ICD-10-CM | POA: Diagnosis not present

## 2016-06-22 DIAGNOSIS — Z7984 Long term (current) use of oral hypoglycemic drugs: Secondary | ICD-10-CM | POA: Diagnosis not present

## 2016-06-22 DIAGNOSIS — R32 Unspecified urinary incontinence: Secondary | ICD-10-CM | POA: Diagnosis present

## 2016-06-22 DIAGNOSIS — R262 Difficulty in walking, not elsewhere classified: Secondary | ICD-10-CM

## 2016-06-22 DIAGNOSIS — Z66 Do not resuscitate: Secondary | ICD-10-CM | POA: Diagnosis present

## 2016-06-22 DIAGNOSIS — Z7982 Long term (current) use of aspirin: Secondary | ICD-10-CM

## 2016-06-22 DIAGNOSIS — R05 Cough: Secondary | ICD-10-CM | POA: Diagnosis not present

## 2016-06-22 DIAGNOSIS — Z833 Family history of diabetes mellitus: Secondary | ICD-10-CM | POA: Diagnosis not present

## 2016-06-22 DIAGNOSIS — R569 Unspecified convulsions: Secondary | ICD-10-CM | POA: Diagnosis not present

## 2016-06-22 DIAGNOSIS — F0390 Unspecified dementia without behavioral disturbance: Secondary | ICD-10-CM | POA: Diagnosis not present

## 2016-06-22 DIAGNOSIS — E119 Type 2 diabetes mellitus without complications: Secondary | ICD-10-CM | POA: Diagnosis not present

## 2016-06-22 DIAGNOSIS — R410 Disorientation, unspecified: Secondary | ICD-10-CM | POA: Diagnosis not present

## 2016-06-22 DIAGNOSIS — I69320 Aphasia following cerebral infarction: Secondary | ICD-10-CM | POA: Diagnosis not present

## 2016-06-22 DIAGNOSIS — I69351 Hemiplegia and hemiparesis following cerebral infarction affecting right dominant side: Secondary | ICD-10-CM | POA: Diagnosis not present

## 2016-06-22 HISTORY — DX: Unspecified convulsions: R56.9

## 2016-06-22 LAB — CBC WITH DIFFERENTIAL/PLATELET
Basophils Absolute: 0.1 10*3/uL (ref 0–0.1)
Basophils Relative: 1 %
EOS ABS: 0.4 10*3/uL (ref 0–0.7)
EOS PCT: 3 %
HCT: 39.7 % (ref 35.0–47.0)
Hemoglobin: 13.3 g/dL (ref 12.0–16.0)
LYMPHS ABS: 2.2 10*3/uL (ref 1.0–3.6)
Lymphocytes Relative: 17 %
MCH: 29.9 pg (ref 26.0–34.0)
MCHC: 33.6 g/dL (ref 32.0–36.0)
MCV: 89.2 fL (ref 80.0–100.0)
MONO ABS: 0.6 10*3/uL (ref 0.2–0.9)
MONOS PCT: 5 %
Neutro Abs: 9.6 10*3/uL — ABNORMAL HIGH (ref 1.4–6.5)
Neutrophils Relative %: 74 %
PLATELETS: 307 10*3/uL (ref 150–440)
RBC: 4.45 MIL/uL (ref 3.80–5.20)
RDW: 13.9 % (ref 11.5–14.5)
WBC: 12.9 10*3/uL — ABNORMAL HIGH (ref 3.6–11.0)

## 2016-06-22 LAB — URINE DRUG SCREEN, QUALITATIVE (ARMC ONLY)
AMPHETAMINES, UR SCREEN: NOT DETECTED
Barbiturates, Ur Screen: NOT DETECTED
Benzodiazepine, Ur Scrn: NOT DETECTED
CANNABINOID 50 NG, UR ~~LOC~~: NOT DETECTED
Cocaine Metabolite,Ur ~~LOC~~: NOT DETECTED
MDMA (ECSTASY) UR SCREEN: NOT DETECTED
Methadone Scn, Ur: NOT DETECTED
Opiate, Ur Screen: NOT DETECTED
PHENCYCLIDINE (PCP) UR S: NOT DETECTED
TRICYCLIC, UR SCREEN: NOT DETECTED

## 2016-06-22 LAB — COMPREHENSIVE METABOLIC PANEL
ALT: 19 U/L (ref 14–54)
AST: 23 U/L (ref 15–41)
Albumin: 3.8 g/dL (ref 3.5–5.0)
Alkaline Phosphatase: 63 U/L (ref 38–126)
Anion gap: 10 (ref 5–15)
BUN: 19 mg/dL (ref 6–20)
CHLORIDE: 101 mmol/L (ref 101–111)
CO2: 26 mmol/L (ref 22–32)
CREATININE: 0.9 mg/dL (ref 0.44–1.00)
Calcium: 10 mg/dL (ref 8.9–10.3)
GFR calc Af Amer: >60 mL/min (ref >60)
GFR, EST NON AFRICAN AMERICAN: 55 mL/min — AB (ref >60)
GLUCOSE: 207 mg/dL — AB (ref 65–99)
Potassium: 3.8 mmol/L (ref 3.5–5.1)
SODIUM: 137 mmol/L (ref 135–145)
Total Bilirubin: 0.6 mg/dL (ref 0.3–1.2)
Total Protein: 7.4 g/dL (ref 6.5–8.1)

## 2016-06-22 LAB — PROTIME-INR
INR: 0.99
Prothrombin Time: 13.1 seconds (ref 11.4–15.2)

## 2016-06-22 LAB — TROPONIN I: Troponin I: <0.03 ng/mL (ref <0.03)

## 2016-06-22 LAB — URINALYSIS, COMPLETE (UACMP) WITH MICROSCOPIC
BACTERIA UA: NONE SEEN
Bilirubin Urine: NEGATIVE
Glucose, UA: NEGATIVE mg/dL
HGB URINE DIPSTICK: NEGATIVE
KETONES UR: 5 mg/dL — AB
NITRITE: NEGATIVE
PROTEIN: NEGATIVE mg/dL
Specific Gravity, Urine: 1.017 (ref 1.005–1.030)
pH: 5 (ref 5.0–8.0)

## 2016-06-22 LAB — ETHANOL

## 2016-06-22 NOTE — ED Notes (Signed)
[phone] [name], Daughter

## 2016-06-22 NOTE — ED Notes (Signed)
SOC completed and family at the bedside denying questions at this time. Informed we will provide update on plan of care as soon as possible.

## 2016-06-22 NOTE — H&P (Signed)
[facility] - [place] at [facility]   PATIENT NAME: Rose Jones    MR#:  [id number]  DATE OF BIRTH:  [birth year]  DATE OF ADMISSION:  06/22/2016  PRIMARY CARE PHYSICIAN: Barbette Reichmann, MD   REQUESTING/REFERRING PHYSICIAN: York Cerise, MD  CHIEF COMPLAINT:   Chief Complaint  Patient presents with  . Weakness  . Altered Mental Status    HISTORY OF PRESENT ILLNESS:  Rose Jones  is a 83 y.o. female who presents with Fall and an episode of what sounds like seizure. Patient was recently seen here and found to have stroke. Since that time her daughter states that she's been having some episodes at home with some confusion, and some leg shaking. Her episode today was similar to that included bowel and bladder incontinence. When she arrived to the ED she was significantly confused. On this writer's interview she has improved cognitively quite a bit. Tele neurology saw the patient and felt the episode was likely a seizure, patient has a remote history of seizures, also recommended repeat MRI for possibility of recurrent stroke. Hospitalists were called for admission  PAST MEDICAL HISTORY:   Past Medical History:  Diagnosis Date  . Dementia   . Diabetes mellitus without complication (HCC)   . History of B-cell lymphoma   . Hypertension   . Seizures (HCC)     PAST SURGICAL HISTORY:   Past Surgical History:  Procedure Laterality Date  . NECK SURGERY      SOCIAL HISTORY:   Social History  Substance Use Topics  . Smoking status: Never Smoker  . Smokeless tobacco: Never Used  . Alcohol use No    FAMILY HISTORY:   Family History  Problem Relation Age of Onset  . Diabetes Father     Died at 62  . Diabetes Sister     DRUG ALLERGIES:   Allergies  Allergen Reactions  . Estrogens Other (See Comments)    Unknown     MEDICATIONS AT HOME:   Prior to Admission medications   Medication Sig Start Date End Date Taking? Authorizing Provider   acetaminophen (TYLENOL) 325 MG tablet Take 2 tablets (650 mg total) by mouth every 6 (six) hours as needed for mild pain (or temp > 37.5 C (99.5 F)). 05/17/16   Ramonita Lab, MD  amLODipine (NORVASC) 5 MG tablet Take 1 tablet by mouth daily. 03/16/16   Historical Provider, MD  aspirin EC 81 MG EC tablet Take 1 tablet (81 mg total) by mouth daily. 05/18/16   Ramonita Lab, MD  atorvastatin (LIPITOR) 40 MG tablet Take 1 tablet (40 mg total) by mouth daily at 6 PM. 05/17/16   Ramonita Lab, MD  Calcium Carbonate-Vitamin D (CALTRATE 600+D PO) Take 1 tablet by mouth daily. 03/16/16   Historical Provider, MD  donepezil (ARICEPT) 5 MG tablet Take 5 mg by mouth daily. 05/06/16   Historical Provider, MD  glipiZIDE (GLUCOTROL) 10 MG tablet Take 1 tablet by mouth daily. 5mg  at night and 10mg  in the morning 05/06/16   Historical Provider, MD  metFORMIN (GLUCOPHAGE) 500 MG tablet Take 1 tablet by mouth 2 (two) times daily. 03/16/16   Historical Provider, MD  senna-docusate (SENOKOT-S) 8.6-50 MG tablet Take 1 tablet by mouth at bedtime as needed for mild constipation or moderate constipation. 05/17/16   Ramonita Lab, MD  sertraline (ZOLOFT) 25 MG tablet Take 1 tablet by mouth daily. 03/16/16   Historical Provider, MD  vitamin B-12 (CYANOCOBALAMIN) 1000 MCG tablet Take 1 tablet by mouth daily.  Historical Provider, MD  Vitamin D, Ergocalciferol, (DRISDOL) 50000 units CAPS capsule Take 1 capsule by mouth once a week. 04/15/15   Historical Provider, MD    REVIEW OF SYSTEMS:  Review of Systems  Constitutional: Negative for chills, fever, malaise/fatigue and weight loss.  HENT: Negative for ear pain, hearing loss and tinnitus.   Eyes: Negative for blurred vision, double vision, pain and redness.  Respiratory: Negative for cough, hemoptysis and shortness of breath.   Cardiovascular: Negative for chest pain, palpitations, orthopnea and leg swelling.  Gastrointestinal: Negative for abdominal pain, constipation, diarrhea,  nausea and vomiting.  Genitourinary: Negative for dysuria, frequency and hematuria.  Musculoskeletal: Negative for back pain, joint pain and neck pain.  Skin:       No acne, rash, or lesions  Neurological: Positive for seizures and loss of consciousness. Negative for dizziness, tremors, focal weakness and weakness.  Endo/Heme/Allergies: Negative for polydipsia. Does not bruise/bleed easily.  Psychiatric/Behavioral: Negative for depression. The patient is not nervous/anxious and does not have insomnia.      VITAL SIGNS:   Vitals:   06/22/16 2023 06/22/16 2030 06/22/16 2100 06/22/16 2130  BP:  138/65 (!) 151/69 137/72  Pulse:  61 60 65  Resp:  16 17 15   Temp: 97.4 F (36.3 C)     TempSrc:      SpO2:  94% 94% 95%  Weight:      Height:       Wt Readings from Last 3 Encounters:  06/22/16 61.2 kg (135 lb)  05/15/16 63.3 kg (139 lb 9.6 oz)    PHYSICAL EXAMINATION:  Physical Exam  Vitals reviewed. Constitutional: She is oriented to person, place, and time. She appears well-developed and well-nourished. No distress.  HENT:  Head: Normocephalic and atraumatic.  Mouth/Throat: Oropharynx is clear and moist.  Eyes: Conjunctivae and EOM are normal. Pupils are equal, round, and reactive to light. No scleral icterus.  Neck: Normal range of motion. Neck supple. No JVD present. No thyromegaly present.  Cardiovascular: Normal rate, regular rhythm and intact distal pulses.  Exam reveals no gallop and no friction rub.   No murmur heard. Respiratory: Effort normal and breath sounds normal. No respiratory distress. She has no wheezes. She has no rales.  GI: Soft. Bowel sounds are normal. She exhibits no distension. There is no tenderness.  Musculoskeletal: Normal range of motion. She exhibits no edema.  No arthritis, no gout  Lymphadenopathy:    She has no cervical adenopathy.  Neurological: She is alert and oriented to person, place, and time. No cranial nerve deficit.  No dysarthria, no  aphasia  Skin: Skin is warm and dry. No rash noted. No erythema.  Psychiatric: She has a normal mood and affect. Her behavior is normal. Judgment and thought content normal.    LABORATORY PANEL:   CBC  Recent Labs Lab 06/22/16 2010  WBC 12.9*  HGB 13.3  HCT 39.7  PLT 307   ------------------------------------------------------------------------------------------------------------------  Chemistries   Recent Labs Lab 06/22/16 2010  NA 137  K 3.8  CL 101  CO2 26  GLUCOSE 207*  BUN 19  CREATININE 0.90  CALCIUM 10.0  AST 23  ALT 19  ALKPHOS 63  BILITOT 0.6   ------------------------------------------------------------------------------------------------------------------  Cardiac Enzymes  Recent Labs Lab 06/22/16 2010  TROPONINI <0.03   ------------------------------------------------------------------------------------------------------------------  RADIOLOGY:  Ct Head Wo Contrast  Result Date: 06/22/2016 CLINICAL DATA:  Acute onset of incontinence, confusion and generalized weakness. Initial encounter. EXAM: CT HEAD WITHOUT CONTRAST TECHNIQUE: Contiguous axial  images were obtained from the base of the skull through the vertex without intravenous contrast. COMPARISON:  MRI/MRA of the brain performed 05/16/2016, and CT of the head performed 05/15/2016 FINDINGS: Brain: No evidence of acute infarction, hemorrhage, hydrocephalus, extra-axial collection or mass lesion/mass effect. Prominence of the ventricles and sulci reflects mild to moderate cortical volume loss. Scattered periventricular and subcortical white matter change likely reflects small vessel ischemic microangiopathy. An evolving subacute to chronic infarct is again noted at the left basal ganglia. The brainstem and fourth ventricle are within normal limits. The cerebral hemispheres demonstrate grossly normal gray-white differentiation. No mass effect or midline shift is seen. Vascular: No hyperdense vessel or  unexpected calcification. Skull: There is no evidence of fracture; visualized osseous structures are unremarkable in appearance. Sinuses/Orbits: The visualized portions of the orbits are within normal limits. The paranasal sinuses and mastoid air cells are well-aerated. Other: No significant soft tissue abnormalities are seen. IMPRESSION: 1. No acute intracranial pathology seen on CT. 2. Evolving subacute to chronic infarct again noted at the left basal ganglia. 3. Mild to moderate cortical volume loss and scattered small vessel ischemic microangiopathy. Electronically Signed   By: Roanna Raider M.D.   On: 06/22/2016 20:21   Dg Chest Portable 1 View  Result Date: 06/22/2016 CLINICAL DATA:  Cough, altered mental status EXAM: PORTABLE CHEST 1 VIEW COMPARISON:  CT chest 10/29/2007 FINDINGS: The heart size and mediastinal contours are within normal limits. Both lungs are clear. The visualized skeletal structures are unremarkable. IMPRESSION: No active disease. Electronically Signed   By: Elige Ko   On: 06/22/2016 21:46    EKG:   Orders placed or performed during the hospital encounter of 06/22/16  . EKG 12-Lead  . EKG 12-Lead  . ED EKG  . ED EKG    IMPRESSION AND PLAN:  Principal Problem:   Seizure (HCC) - We will admit with stroke workup order set as recommended by teleneurology, with appropriate imaging including MRI, but also with EEG and neurology consult Active Problems:   CVA (cerebral vascular accident) (HCC) - workup as above   HTN (hypertension) - continue home meds   Diabetes (HCC) - sliding scale insulin with corresponding glucose checks   Dementia - continue home meds  All the records are reviewed and case discussed with ED provider. Management plans discussed with the patient and/or family.  DVT PROPHYLAXIS: SubQ lovenox  GI PROPHYLAXIS: None  ADMISSION STATUS: Inpatient  CODE STATUS: DNR Code Status History    Date Active Date Inactive Code Status Order ID Comments  User Context   05/16/2016  5:07 PM 05/17/2016  9:19 PM DNR [id number]  Ramonita Lab, MD Inpatient   05/15/2016 10:11 PM 05/16/2016  5:07 PM Full Code [id number]  Tonye Royalty, DO Inpatient    Questions for Most Recent Historical Code Status (Order [id number])    Question Answer Comment   In the event of cardiac or respiratory ARREST Do not call a "code blue"    In the event of cardiac or respiratory ARREST Do not perform Intubation, CPR, defibrillation or ACLS    In the event of cardiac or respiratory ARREST Use medication by any route, position, wound care, and other measures to relive pain and suffering. May use oxygen, suction and manual treatment of airway obstruction as needed for comfort.    Comments RN MAY PRONOUNCE         Advance Directive Documentation   Flowsheet Row Most Recent Value  Type of Advance Directive  Healthcare  Power of Attorney, Living will  Pre-existing out of facility DNR order (yellow form or pink MOST form)  No data  "MOST" Form in Place?  No data      TOTAL TIME TAKING CARE OF THIS PATIENT: 45 minutes.    Lisl Slingerland FIELDING 06/22/2016, 11:46 PM  [facility]  Office  [phone]  CC: Primary care physician; Barbette ReichmannHANDE,VISHWANATH, MD

## 2016-06-22 NOTE — ED Provider Notes (Signed)
[facility] Emergency Department Provider Note  ____________________________________________   First MD Initiated Contact with Patient 06/22/16 2000     (approximate)  I have reviewed the triage vital signs and the nursing notes.   HISTORY  Chief Complaint Weakness and Altered Mental Status    HPI Rose Jones is a 83 y.o. female with a history of proximal one month ago of cerebral infarction with some resulting expressive aphasia which has improved over time.  She presents with her daughter by private vehicle for acute onset incontinence of bladder and bowel, altered mental status/confusion,decreased responsiveness, which occurred acutely a couple of hours prior to arrival .  Daughter was having dinner with the patient when the patient seemed to stare into space and ceased responding to the daughter.  Patient lost control of bowel and bladder and had some leg shaking.  She was not able to articulate what was wrong, but said something similar to "I just want it to stop."  Did not indicate any pain.  She was not able to ambulate by herself after the episode, which lasted a couple of minutes, and she remained very confused.    The patient had a CVA about 1 month ago but was not a candidate for tPA at the time because her onset of symptoms had been about 2 days prior.  Daughter is concerned this may be another CVA.  Patient take a daily baby aspirin for anticoagulation.  She has had a mild cough for a couple of days but no fever/chills, CP, SOB, abd pain, N/V, dysuria.  Her episode was acute in onset and severe.  Nothing in particular made it better nor worse.   Past Medical History:  Diagnosis Date  . Dementia   . Diabetes mellitus without complication (HCC)   . History of B-cell lymphoma   . Hypertension   . Seizures [facility])     Patient Active Problem List   Diagnosis Date Noted  . Seizure (HCC) 06/22/2016  . HTN (hypertension) 06/22/2016  . Diabetes  (HCC) 06/22/2016  . Dementia 06/22/2016  . CVA (cerebral vascular accident) (HCC) 05/15/2016    Past Surgical History:  Procedure Laterality Date  . NECK SURGERY      Prior to Admission medications   Medication Sig Start Date End Date Taking? Authorizing Provider  acetaminophen (TYLENOL) 325 MG tablet Take 2 tablets (650 mg total) by mouth every 6 (six) hours as needed for mild pain (or temp > 37.5 C (99.5 F)). 05/17/16  Yes Ramonita Lab, MD  amLODipine (NORVASC) 5 MG tablet Take 1 tablet by mouth daily. 03/16/16  Yes Historical Provider, MD  aspirin EC 81 MG EC tablet Take 1 tablet (81 mg total) by mouth daily. 05/18/16  Yes Ramonita Lab, MD  atorvastatin (LIPITOR) 40 MG tablet Take 1 tablet (40 mg total) by mouth daily at 6 PM. 05/17/16  Yes Ramonita Lab, MD  Calcium Carbonate-Vitamin D (CALTRATE 600+D PO) Take 1 tablet by mouth daily. 03/16/16  Yes Historical Provider, MD  donepezil (ARICEPT) 5 MG tablet Take 5 mg by mouth daily. 05/06/16  Yes Historical Provider, MD  glipiZIDE (GLUCOTROL) 10 MG tablet Take 10 mg by mouth daily before breakfast.  05/06/16  Yes Historical Provider, MD  glipiZIDE (GLUCOTROL) 5 MG tablet Take 5 mg by mouth every evening.   Yes Historical Provider, MD  metFORMIN (GLUCOPHAGE) 500 MG tablet Take 1 tablet by mouth 2 (two) times daily. 03/16/16  Yes Historical Provider, MD  senna-docusate (SENOKOT-S) 8.6-50  MG tablet Take 1 tablet by mouth at bedtime as needed for mild constipation or moderate constipation. 05/17/16  Yes Ramonita Lab, MD  sertraline (ZOLOFT) 25 MG tablet Take 1 tablet by mouth daily. 03/16/16  Yes Historical Provider, MD  vitamin B-12 (CYANOCOBALAMIN) 1000 MCG tablet Take 1 tablet by mouth daily.   Yes Historical Provider, MD  Vitamin D, Ergocalciferol, (DRISDOL) 50000 units CAPS capsule Take 1 capsule by mouth once a week. 04/15/15  Yes Historical Provider, MD    Allergies Estrogens  Family History  Problem Relation Age of Onset  . Diabetes  Father     Died at 77  . Diabetes Sister     Social History Social History  Substance Use Topics  . Smoking status: Never Smoker  . Smokeless tobacco: Never Used  . Alcohol use No    Review of Systems Constitutional: No fever/chills Eyes: No visual changes. ENT: No sore throat. Cardiovascular: Denies chest pain. Respiratory: Denies shortness of breath.  +Recent cough. Gastrointestinal: No abdominal pain.  No nausea, no vomiting.  No diarrhea.  No constipation.  Loss of bowel control during acute episode tonight. Genitourinary: Negative for dysuria.  Loss of bladder control during acute episode tonight. Musculoskeletal: Negative for back pain. Skin: Negative for rash. Neurological: Negative for headaches, focal weakness or numbness.  Some leg tremor during the episode.  Difficulty speaking.  AMS.  10-point ROS otherwise negative.  ____________________________________________   PHYSICAL EXAM:  VITAL SIGNS: ED Triage Vitals [06/22/16 1958]  Enc Vitals Group     BP (!) 146/65     Pulse Rate 61     Resp 18     Temp 97.5 F (36.4 C)     Temp Source Oral     SpO2 94 %     Weight 135 lb (61.2 kg)     Height 4\' 11"  (1.499 m)     Head Circumference      Peak Flow      Pain Score      Pain Loc      Pain Edu?      Excl. in GC?     Constitutional: Responds to voice but speaks very slowly. Is not somnolent, but seems confused and "not all there" Eyes: Conjunctivae are normal. PERRL. EOMI.  No nystagmus Head: Atraumatic. Nose: No congestion/rhinnorhea. Mouth/Throat: Mucous membranes are moist.  Oropharynx non-erythematous. Neck: No stridor.  No meningeal signs.   Cardiovascular: Normal rate, regular rhythm. Good peripheral circulation. Grossly normal heart sounds. Respiratory: Normal respiratory effort.  No retractions. Lungs CTAB. Gastrointestinal: Soft and nontender. No distention.  Musculoskeletal: No lower extremity tenderness nor edema. No gross deformities of  extremities. Neurologic:  Normal speech and language. No gross focal neurologic deficits are appreciated, but patient cannot follow commands well enough to participate in comprehensive neuro eam Skin:  Skin is warm, dry and intact. No rash noted.  ____________________________________________   LABS (all labs ordered are listed, but only abnormal results are displayed)  Labs Reviewed  COMPREHENSIVE METABOLIC PANEL - Abnormal; Notable for the following:       Result Value   Glucose, Bld 207 (*)    GFR calc non Af Amer 55 (*)    All other components within normal limits  CBC WITH DIFFERENTIAL/PLATELET - Abnormal; Notable for the following:    WBC 12.9 (*)    Neutro Abs 9.6 (*)    All other components within normal limits  URINALYSIS, COMPLETE (UACMP) WITH MICROSCOPIC - Abnormal; Notable for the following:  Color, Urine YELLOW (*)    APPearance CLEAR (*)    Ketones, ur 5 (*)    Leukocytes, UA TRACE (*)    Squamous Epithelial / LPF 0-5 (*)    All other components within normal limits  GLUCOSE, CAPILLARY - Abnormal; Notable for the following:    Glucose-Capillary 134 (*)    All other components within normal limits  ETHANOL  TROPONIN I  PROTIME-INR  URINE DRUG SCREEN, QUALITATIVE ([facility] ONLY)  HEMOGLOBIN A1C  LIPID PANEL  CBC  CREATININE, SERUM   ____________________________________________  EKG  ED ECG REPORT I, Abbie Jablon, the attending physician, personally viewed and interpreted this ECG.  Date: 06/23/2016 EKG Time: 19:57 Rate: 60 Rhythm: normal sinus rhythm QRS Axis: normal Intervals: normal ST/T Wave abnormalities: normal Conduction Disturbances: none Narrative Interpretation: unremarkable  ____________________________________________  RADIOLOGY   Ct Head Wo Contrast  Result Date: 06/22/2016 CLINICAL DATA:  Acute onset of incontinence, confusion and generalized weakness. Initial encounter. EXAM: CT HEAD WITHOUT CONTRAST TECHNIQUE: Contiguous axial  images were obtained from the base of the skull through the vertex without intravenous contrast. COMPARISON:  MRI/MRA of the brain performed 05/16/2016, and CT of the head performed 05/15/2016 FINDINGS: Brain: No evidence of acute infarction, hemorrhage, hydrocephalus, extra-axial collection or mass lesion/mass effect. Prominence of the ventricles and sulci reflects mild to moderate cortical volume loss. Scattered periventricular and subcortical white matter change likely reflects small vessel ischemic microangiopathy. An evolving subacute to chronic infarct is again noted at the left basal ganglia. The brainstem and fourth ventricle are within normal limits. The cerebral hemispheres demonstrate grossly normal gray-white differentiation. No mass effect or midline shift is seen. Vascular: No hyperdense vessel or unexpected calcification. Skull: There is no evidence of fracture; visualized osseous structures are unremarkable in appearance. Sinuses/Orbits: The visualized portions of the orbits are within normal limits. The paranasal sinuses and mastoid air cells are well-aerated. Other: No significant soft tissue abnormalities are seen. IMPRESSION: 1. No acute intracranial pathology seen on CT. 2. Evolving subacute to chronic infarct again noted at the left basal ganglia. 3. Mild to moderate cortical volume loss and scattered small vessel ischemic microangiopathy. Electronically Signed   By: Roanna RaiderJeffery  Chang M.D.   On: 06/22/2016 20:21   Dg Chest Portable 1 View  Result Date: 06/22/2016 CLINICAL DATA:  Cough, altered mental status EXAM: PORTABLE CHEST 1 VIEW COMPARISON:  CT chest 10/29/2007 FINDINGS: The heart size and mediastinal contours are within normal limits. Both lungs are clear. The visualized skeletal structures are unremarkable. IMPRESSION: No active disease. Electronically Signed   By: Elige KoHetal  Patel   On: 06/22/2016 21:46    ____________________________________________   PROCEDURES  Procedure(s)  performed:   Procedures   Critical Care performed: No ____________________________________________   INITIAL IMPRESSION / ASSESSMENT AND PLAN / ED COURSE  Pertinent labs & imaging results that were available during my care of the patient were reviewed by me and considered in my medical decision making (see chart for details).  The patient is NOT a candidate for tPA given the known ischemic CVA that occurred last month.  Her symptoms do not appear consistent with a CVA at this time, and I will attempt to rule out infectious causes (delirium).  I will, however, request a teleneuro evaluation for their input.  CT head was non-acute.   Clinical Course as of Jun 23 202  Wed Jun 22, 2016  2139 I spoke by phone with the specialist on-call neurologist who feels strongly that her symptoms today  do not represent an acute CVA.  He mentioned that given the symptoms of some trembling of her leg and the incontinence of bowel and bladder and the altered mental status that seems to have improved it is possible she may have had a small seizure.  I will discuss with the family but anticipate admission for observation and further management of her acute altered mental status and possible seizure.  [CF]    Clinical Course User Index [CF] Loleta Rose, MD    ____________________________________________  FINAL CLINICAL IMPRESSION(S) / ED DIAGNOSES  Final diagnoses:  Altered mental status, unspecified altered mental status type     MEDICATIONS GIVEN DURING THIS VISIT:  Medications  donepezil (ARICEPT) tablet 5 mg (not administered)  amLODipine (NORVASC) tablet 5 mg (not administered)  aspirin EC tablet 81 mg (not administered)  atorvastatin (LIPITOR) tablet 40 mg (not administered)  sertraline (ZOLOFT) tablet 25 mg (not administered)   stroke: mapping our early stages of recovery book (not administered)  acetaminophen (TYLENOL) tablet 650 mg (not administered)    Or  acetaminophen (TYLENOL)  solution 650 mg (not administered)    Or  acetaminophen (TYLENOL) suppository 650 mg (not administered)  enoxaparin (LOVENOX) injection 40 mg (not administered)  insulin aspart (novoLOG) injection 0-9 Units (not administered)  insulin aspart (novoLOG) injection 0-5 Units (not administered)     NEW OUTPATIENT MEDICATIONS STARTED DURING THIS VISIT:  Current Discharge Medication List      Current Discharge Medication List      Current Discharge Medication List       Note:  This document was prepared using Dragon voice recognition software and may include unintentional dictation errors.    Loleta Rose, MD 06/23/16 [phone]

## 2016-06-22 NOTE — ED Triage Notes (Addendum)
Pt to triage via w/c with no distress noted; daughter reports sudden onset at approx 530pm, incont, confusion, weakness; CVA just before Christmas; charge nurse notified and pt taken immed to CT

## 2016-06-22 NOTE — ED Notes (Signed)
Pt is able to stand and pivot with assistance but generalized weakness noted; pt denies any c/o pain or illness; alert to place and self; From CT to room 25; pt placed in hosp gown and on card monitor

## 2016-06-23 ENCOUNTER — Inpatient Hospital Stay: Payer: Medicare Other

## 2016-06-23 DIAGNOSIS — R32 Unspecified urinary incontinence: Secondary | ICD-10-CM | POA: Diagnosis not present

## 2016-06-23 DIAGNOSIS — Z833 Family history of diabetes mellitus: Secondary | ICD-10-CM | POA: Diagnosis not present

## 2016-06-23 DIAGNOSIS — I69351 Hemiplegia and hemiparesis following cerebral infarction affecting right dominant side: Secondary | ICD-10-CM | POA: Diagnosis not present

## 2016-06-23 DIAGNOSIS — R569 Unspecified convulsions: Principal | ICD-10-CM

## 2016-06-23 DIAGNOSIS — I639 Cerebral infarction, unspecified: Secondary | ICD-10-CM | POA: Diagnosis not present

## 2016-06-23 DIAGNOSIS — I10 Essential (primary) hypertension: Secondary | ICD-10-CM | POA: Diagnosis not present

## 2016-06-23 DIAGNOSIS — F0390 Unspecified dementia without behavioral disturbance: Secondary | ICD-10-CM | POA: Diagnosis not present

## 2016-06-23 DIAGNOSIS — I69320 Aphasia following cerebral infarction: Secondary | ICD-10-CM | POA: Diagnosis not present

## 2016-06-23 DIAGNOSIS — Z8572 Personal history of non-Hodgkin lymphomas: Secondary | ICD-10-CM | POA: Diagnosis not present

## 2016-06-23 DIAGNOSIS — Z79899 Other long term (current) drug therapy: Secondary | ICD-10-CM | POA: Diagnosis not present

## 2016-06-23 DIAGNOSIS — E119 Type 2 diabetes mellitus without complications: Secondary | ICD-10-CM | POA: Diagnosis not present

## 2016-06-23 DIAGNOSIS — R4182 Altered mental status, unspecified: Secondary | ICD-10-CM | POA: Diagnosis not present

## 2016-06-23 DIAGNOSIS — Z66 Do not resuscitate: Secondary | ICD-10-CM | POA: Diagnosis not present

## 2016-06-23 LAB — CBC
HCT: 37.1 % (ref 35.0–47.0)
HEMOGLOBIN: 13 g/dL (ref 12.0–16.0)
MCH: 30.8 pg (ref 26.0–34.0)
MCHC: 35 g/dL (ref 32.0–36.0)
MCV: 87.9 fL (ref 80.0–100.0)
PLATELETS: 307 10*3/uL (ref 150–440)
RBC: 4.22 MIL/uL (ref 3.80–5.20)
RDW: 13.9 % (ref 11.5–14.5)
WBC: 8.7 10*3/uL (ref 3.6–11.0)

## 2016-06-23 LAB — CREATININE, SERUM: Creatinine, Ser: 0.53 mg/dL (ref 0.44–1.00)

## 2016-06-23 LAB — LIPID PANEL
CHOL/HDL RATIO: 3.3 ratio
Cholesterol: 163 mg/dL (ref 0–200)
HDL: 50 mg/dL (ref >40)
LDL CALC: 94 mg/dL (ref 0–99)
Triglycerides: 93 mg/dL (ref <150)
VLDL: 19 mg/dL (ref 0–40)

## 2016-06-23 LAB — GLUCOSE, CAPILLARY
GLUCOSE-CAPILLARY: 134 mg/dL — AB (ref 65–99)
GLUCOSE-CAPILLARY: 172 mg/dL — AB (ref 65–99)
Glucose-Capillary: 156 mg/dL — ABNORMAL HIGH (ref 65–99)
Glucose-Capillary: 231 mg/dL — ABNORMAL HIGH (ref 65–99)

## 2016-06-23 MED ORDER — ATORVASTATIN CALCIUM 20 MG PO TABS: 40.0000 mg | ORAL_TABLET | Freq: Every day | ORAL | Status: DC

## 2016-06-23 MED ORDER — INSULIN ASPART 100 UNIT/ML ~~LOC~~ SOLN
0.0000 [IU] | Freq: Three times a day (TID) | SUBCUTANEOUS | Status: DC
  Administered 2016-06-23: 17:00:00 3 [IU] via SUBCUTANEOUS
  Administered 2016-06-23 (×2): 2 [IU] via SUBCUTANEOUS
  Filled 2016-06-23: qty 3
  Filled 2016-06-23 (×2): qty 2

## 2016-06-23 MED ORDER — ACETAMINOPHEN 650 MG RE SUPP: 650.0000 mg | RECTAL | Status: DC | PRN

## 2016-06-23 MED ORDER — SERTRALINE HCL 50 MG PO TABS
25.0000 mg | ORAL_TABLET | Freq: Every day | ORAL | Status: DC
  Administered 2016-06-23: 25 mg via ORAL
  Filled 2016-06-23: qty 1

## 2016-06-23 MED ORDER — STROKE: EARLY STAGES OF RECOVERY BOOK
Freq: Once | Status: AC
  Administered 2016-06-23: 02:00:00

## 2016-06-23 MED ORDER — ASPIRIN EC 81 MG PO TBEC
81.0000 mg | DELAYED_RELEASE_TABLET | Freq: Every day | ORAL | Status: DC
  Administered 2016-06-23: 81 mg via ORAL
  Filled 2016-06-23: qty 1

## 2016-06-23 MED ORDER — ENOXAPARIN SODIUM 40 MG/0.4ML ~~LOC~~ SOLN: 40.0000 mg | SUBCUTANEOUS | Status: DC

## 2016-06-23 MED ORDER — ACETAMINOPHEN 160 MG/5ML PO SOLN: 650.0000 mg | ORAL | Status: DC | PRN

## 2016-06-23 MED ORDER — ACETAMINOPHEN 325 MG PO TABS: 650.0000 mg | ORAL_TABLET | ORAL | Status: DC | PRN

## 2016-06-23 MED ORDER — INSULIN ASPART 100 UNIT/ML ~~LOC~~ SOLN: 0.0000 [IU] | Freq: Every day | SUBCUTANEOUS | Status: DC

## 2016-06-23 MED ORDER — AMLODIPINE BESYLATE 5 MG PO TABS
5.0000 mg | ORAL_TABLET | Freq: Every day | ORAL | Status: DC
Start: 2016-06-23 — End: 2016-06-23
  Administered 2016-06-23: 09:00:00 5 mg via ORAL
  Filled 2016-06-23: qty 1

## 2016-06-23 MED ORDER — DONEPEZIL HCL 5 MG PO TABS
5.0000 mg | ORAL_TABLET | Freq: Every day | ORAL | Status: DC
  Administered 2016-06-23: 5 mg via ORAL
  Filled 2016-06-23: qty 1

## 2016-06-23 NOTE — Discharge Instructions (Signed)
Confusion Introduction Confusion is the inability to think with your usual speed or clarity. Confusion may come on quickly or slowly over time. How quickly the confusion comes on depends on the cause. Confusion can be due to any number of causes. What are the causes?  Concussion, head injury, or head trauma.  Seizures.  Stroke.  Fever.  Brain tumor.  Age related decreased brain function (dementia).  Heightened emotional states like rage or terror.  Mental illness in which the person loses the ability to determine what is real and what is not (hallucinations).  Infections such as a urinary tract infection (UTI).  Toxic effects from alcohol, drugs, or prescription medicines.  Dehydration and an imbalance of salts in the body (electrolytes).  Lack of sleep.  Low blood sugar (diabetes).  Low levels of oxygen from conditions such as chronic lung disorders.  Drug interactions or other medicine side effects.  Nutritional deficiencies, especially niacin, thiamine, vitamin C, or vitamin B.  Sudden drop in body temperature (hypothermia).  Change in routine, such as when traveling or hospitalized. What are the signs or symptoms? People often describe their thinking as cloudy or unclear when they are confused. Confusion can also include feeling disoriented. That means you are unaware of where or who you are. You may also not know what the date or time is. If confused, you may also have difficulty paying attention, remembering, and making decisions. Some people also act aggressively when they are confused. How is this diagnosed? The medical evaluation of confusion may include:  Blood and urine tests.  X-rays.  Brain and nervous system tests.  Analyzing your brain waves (electroencephalogram or EEG).  Magnetic resonance imaging (MRI) of your head.  Computed tomography (CT) scan of your head.  Mental status tests in which your health care provider may ask many questions.  Some of these questions may seem silly or strange, but they are a very important test to help diagnose and treat confusion. How is this treated? An admission to the hospital may not be needed, but a person with confusion should not be left alone. Stay with a family member or friend until the confusion clears. Avoid alcohol, pain relievers, or sedative drugs until you have fully recovered. Do not drive until directed by your health care provider. Follow these instructions at home: What family and friends can do:  To find out if someone is confused, ask the person to state his or her name, age, and the date. If the person is unsure or answers incorrectly, he or she is confused.  Always introduce yourself, no matter how well the person knows you.  Often remind the person of his or her location.  Place a calendar and clock near the confused person.  Help the person with his or her medicines. You may want to use a pill box, an alarm as a reminder, or give the person each dose as prescribed.  Talk about current events and plans for the day.  Try to keep the environment calm, quiet, and peaceful.  Make sure the person keeps follow-up visits with his or her health care provider. How is this prevented? Ways to prevent confusion:  Avoid alcohol.  Eat a balanced diet.  Get enough sleep.  Take medicine only as directed by your health care provider.  Do not become isolated. Spend time with other people and make plans for your days.  Keep careful watch on your blood sugar levels if you are diabetic. Get help right away if:    You develop severe headaches, repeated vomiting, seizures, blackouts, or slurred speech.  There is increasing confusion, weakness, numbness, restlessness, or personality changes.  You develop a loss of balance, have marked dizziness, feel uncoordinated, or fall.  You have delusions, hallucinations, or develop severe anxiety.  Your family members think you need to be  rechecked. This information is not intended to replace advice given to you by your health care provider. Make sure you discuss any questions you have with your health care provider. Document Released: 06/23/2004 Document Revised: 12/04/2015 Document Reviewed: 06/21/2013  2017 [employer]vier  

## 2016-06-23 NOTE — Progress Notes (Signed)
OT Cancellation Note  Patient Details Name: Shane CrutchBeulah Granderson MRN: [id number] DOB: [birth year]   Cancelled Treatment:    Reason Eval/Treat Not Completed: Patient at procedure or test/ unavailable  Olegario MessierElaine Camaya Gannett , MS, OTR/L 06/23/2016, 11:58 AM

## 2016-06-23 NOTE — Consult Note (Signed)
Reason for Consult:Altered mental status Referring Physician: Sherryll Burger  CC: Altered mental status  HPI: Rose Jones is an 83 y.o. female with a history of stroke in December of 2017 who was discharged to rehab and just returned home on 1/9.  Since that time patient has had periods when she does not want to eat.  Has also had a spell lasting 2-3 days when she as not as functional, needing help with ambulation and ADL.  Patient gradually improved until was suddenly back to baseline.  On yesterday with confusion, decreased responsiveness and bowel/bladder incontinence.  Patient stared into space.  Was brought in after episode lasted a couple of hours but after arrival patient seemed to return to baseline.  SOC evaluated patient and was concerned for seizure.       Past Medical History:  Diagnosis Date  . Dementia   . Diabetes mellitus without complication (HCC)   . History of B-cell lymphoma   . Hypertension   . Seizures (HCC)     Past Surgical History:  Procedure Laterality Date  . NECK SURGERY      Family History  Problem Relation Age of Onset  . Diabetes Father     Died at 62  . Diabetes Sister     Social History:  reports that she has never smoked. She has never used smokeless tobacco. She reports that she does not drink alcohol or use drugs.  Allergies  Allergen Reactions  . Estrogens Other (See Comments)    Unknown     Medications:  I have reviewed the patient's current medications. Prior to Admission:  Prescriptions Prior to Admission  Medication Sig Dispense Refill Last Dose  . acetaminophen (TYLENOL) 325 MG tablet Take 2 tablets (650 mg total) by mouth every 6 (six) hours as needed for mild pain (or temp > 37.5 C (99.5 F)).   prn  . amLODipine (NORVASC) 5 MG tablet Take 1 tablet by mouth daily.   06/22/2016 at Unknown time  . aspirin EC 81 MG EC tablet Take 1 tablet (81 mg total) by mouth daily.   06/22/2016 at Unknown time  . atorvastatin (LIPITOR) 40 MG tablet Take 1  tablet (40 mg total) by mouth daily at 6 PM. 30 tablet 0 Past Week at Unknown time  . Calcium Carbonate-Vitamin D (CALTRATE 600+D PO) Take 1 tablet by mouth daily.   06/22/2016 at Unknown time  . donepezil (ARICEPT) 5 MG tablet Take 5 mg by mouth daily.   06/22/2016 at Unknown time  . glipiZIDE (GLUCOTROL) 10 MG tablet Take 10 mg by mouth daily before breakfast.    06/22/2016 at Unknown time  . glipiZIDE (GLUCOTROL) 5 MG tablet Take 5 mg by mouth every evening.   Past Week at Unknown time  . metFORMIN (GLUCOPHAGE) 500 MG tablet Take 1 tablet by mouth 2 (two) times daily.   06/22/2016 at Unknown time  . senna-docusate (SENOKOT-S) 8.6-50 MG tablet Take 1 tablet by mouth at bedtime as needed for mild constipation or moderate constipation.   Past Week at Unknown time  . sertraline (ZOLOFT) 25 MG tablet Take 1 tablet by mouth daily.   06/22/2016 at Unknown time  . vitamin B-12 (CYANOCOBALAMIN) 1000 MCG tablet Take 1 tablet by mouth daily.   06/22/2016 at Unknown time  . Vitamin D, Ergocalciferol, (DRISDOL) 50000 units CAPS capsule Take 1 capsule by mouth once a week.   Past Week at Unknown time   Scheduled: . amLODipine  5 mg Oral Daily  .  aspirin EC  81 mg Oral Daily  . atorvastatin  40 mg Oral q1800  . donepezil  5 mg Oral Daily  . enoxaparin (LOVENOX) injection  40 mg Subcutaneous Q24H  . insulin aspart  0-5 Units Subcutaneous QHS  . insulin aspart  0-9 Units Subcutaneous TID WC  . sertraline  25 mg Oral Daily    ROS: History obtained from the patient  General ROS: negative for - chills, fatigue, fever, night sweats, weight gain or weight loss Psychological ROS: negative for - behavioral disorder, hallucinations, memory difficulties, mood swings or suicidal ideation Ophthalmic ROS: negative for - blurry vision, double vision, eye pain or loss of vision ENT ROS: negative for - epistaxis, nasal discharge, oral lesions, sore throat, tinnitus or vertigo Allergy and Immunology ROS: negative for -  hives or itchy/watery eyes Hematological and Lymphatic ROS: negative for - bleeding problems, bruising or swollen lymph nodes Endocrine ROS: negative for - galactorrhea, hair pattern changes, polydipsia/polyuria or temperature intolerance Respiratory ROS: negative for - cough, hemoptysis, shortness of breath or wheezing Cardiovascular ROS: negative for - chest pain, dyspnea on exertion, edema or irregular heartbeat Gastrointestinal ROS: negative for - abdominal pain, diarrhea, hematemesis, nausea/vomiting or stool incontinence Genito-Urinary ROS: negative for - dysuria, hematuria, incontinence or urinary frequency/urgency Musculoskeletal ROS: negative for - joint swelling or muscular weakness Neurological ROS: as noted in HPI Dermatological ROS: negative for rash and skin lesion changes  Physical Examination: Blood pressure (!) 152/56, pulse 63, temperature 98.1 F (36.7 C), temperature source Oral, resp. rate 16, height 5' (1.524 m), weight 61.5 kg (135 lb 9.6 oz), SpO2 92 %.  HEENT-  Normocephalic, no lesions, without obvious abnormality.  Normal external eye and conjunctiva.  Normal TM's bilaterally.  Normal auditory canals and external ears. Normal external nose, mucus membranes and septum.  Normal pharynx. Cardiovascular- S1, S2 normal, pulses palpable throughout   Lungs- chest clear, no wheezing, rales, normal symmetric air entry Abdomen- soft, non-tender; bowel sounds normal; no masses,  no organomegaly Extremities- no edema Lymph-no adenopathy palpable Musculoskeletal-no joint tenderness, deformity or swelling Skin-warm and dry, no hyperpigmentation, vitiligo, or suspicious lesions  Neurological Examination Mental Status: Alert, at times inattentive.  Speech fluent without evidence of aphasia.  Able to follow 3 step commands without difficulty. Cranial Nerves: II: Discs flat bilaterally; Visual fields grossly normal, pupils equal, round, reactive to light and  accommodation III,IV, VI: ptosis not present, extra-ocular motions intact bilaterally V,VII: right facial droop, facial light touch sensation normal bilaterally VIII: hearing normal bilaterally IX,X: gag reflex present XI: bilateral shoulder shrug XII: midline tongue extension Motor: Right : Upper extremity   4/5    Left:     Upper extremity   5/5  Lower extremity   5-/5     Lower extremity   5/5 Tone and bulk:normal tone throughout; no atrophy noted Sensory: Pinprick and light touch intact throughout, bilaterally Deep Tendon Reflexes: 1+ in the upper extremities and absent in the lower extremities Plantars: Right: mute   Left: mute Cerebellar: Normal finger-to-nose testing bilaterally Gait: not tested due to safety concerns    Laboratory Studies:   Basic Metabolic Panel:  Recent Labs Lab 06/22/16 2010 06/23/16 0522  NA 137  --   K 3.8  --   CL 101  --   CO2 26  --   GLUCOSE 207*  --   BUN 19  --   CREATININE 0.90 0.53  CALCIUM 10.0  --     Liver Function Tests:  Recent Labs Lab 06/22/16 2010  AST 23  ALT 19  ALKPHOS 63  BILITOT 0.6  PROT 7.4  ALBUMIN 3.8   No results for input(s): LIPASE, AMYLASE in the last 168 hours. No results for input(s): AMMONIA in the last 168 hours.  CBC:  Recent Labs Lab 06/22/16 2010 06/23/16 0522  WBC 12.9* 8.7  NEUTROABS 9.6*  --   HGB 13.3 13.0  HCT 39.7 37.1  MCV 89.2 87.9  PLT 307 307    Cardiac Enzymes:  Recent Labs Lab 06/22/16 2010  TROPONINI <0.03    BNP: Invalid input(s): POCBNP  CBG:  Recent Labs Lab 06/23/16 0153 06/23/16 0729 06/23/16 1236  GLUCAP 134* 156* 172*    Microbiology: No results found for this or any previous visit.  Coagulation Studies:  Recent Labs  06/22/16 2010  LABPROT 13.1  INR 0.99    Urinalysis:  Recent Labs Lab 06/22/16 2010  COLORURINE YELLOW*  LABSPEC 1.017  PHURINE 5.0  GLUCOSEU NEGATIVE  HGBUR NEGATIVE  BILIRUBINUR NEGATIVE  KETONESUR 5*   PROTEINUR NEGATIVE  NITRITE NEGATIVE  LEUKOCYTESUR TRACE*    Lipid Panel:     Component Value Date/Time   CHOL 163 06/23/2016 0522   TRIG 93 06/23/2016 0522   HDL 50 06/23/2016 0522   CHOLHDL 3.3 06/23/2016 0522   VLDL 19 06/23/2016 0522   LDLCALC 94 06/23/2016 0522    HgbA1C:  Lab Results  Component Value Date   HGBA1C 9.6 (H) 05/16/2016    Urine Drug Screen:     Component Value Date/Time   LABOPIA NONE DETECTED 06/22/2016 2010   COCAINSCRNUR NONE DETECTED 06/22/2016 2010   LABBENZ NONE DETECTED 06/22/2016 2010   AMPHETMU NONE DETECTED 06/22/2016 2010   THCU NONE DETECTED 06/22/2016 2010   LABBARB NONE DETECTED 06/22/2016 2010    Alcohol Level:  Recent Labs Lab 06/22/16 2010  ETH <5    Other results: EKG: sinus rhythm at 60 bpm.  Imaging: Ct Head Wo Contrast  Result Date: 06/22/2016 CLINICAL DATA:  Acute onset of incontinence, confusion and generalized weakness. Initial encounter. EXAM: CT HEAD WITHOUT CONTRAST TECHNIQUE: Contiguous axial images were obtained from the base of the skull through the vertex without intravenous contrast. COMPARISON:  MRI/MRA of the brain performed 05/16/2016, and CT of the head performed 05/15/2016 FINDINGS: Brain: No evidence of acute infarction, hemorrhage, hydrocephalus, extra-axial collection or mass lesion/mass effect. Prominence of the ventricles and sulci reflects mild to moderate cortical volume loss. Scattered periventricular and subcortical white matter change likely reflects small vessel ischemic microangiopathy. An evolving subacute to chronic infarct is again noted at the left basal ganglia. The brainstem and fourth ventricle are within normal limits. The cerebral hemispheres demonstrate grossly normal gray-white differentiation. No mass effect or midline shift is seen. Vascular: No hyperdense vessel or unexpected calcification. Skull: There is no evidence of fracture; visualized osseous structures are unremarkable in  appearance. Sinuses/Orbits: The visualized portions of the orbits are within normal limits. The paranasal sinuses and mastoid air cells are well-aerated. Other: No significant soft tissue abnormalities are seen. IMPRESSION: 1. No acute intracranial pathology seen on CT. 2. Evolving subacute to chronic infarct again noted at the left basal ganglia. 3. Mild to moderate cortical volume loss and scattered small vessel ischemic microangiopathy. Electronically Signed   By: Roanna Raider M.D.   On: 06/22/2016 20:21   Mr Brain Wo Contrast  Result Date: 06/23/2016 CLINICAL DATA:  83 year old female status post possible seizure. Suffered a left basal ganglia infarct last  month. Initial encounter. EXAM: MRI HEAD WITHOUT CONTRAST MRA HEAD WITHOUT CONTRAST TECHNIQUE: Multiplanar, multiecho pulse sequences of the brain and surrounding structures were obtained without intravenous contrast. Angiographic images of the head were obtained using MRA technique without contrast. COMPARISON:  Head CT without contrast 06/22/2016. Brain MRI and intracranial MRA 05/16/2016 FINDINGS: MRI HEAD FINDINGS Brain: Expected evolution of the D similar infarct extending from the left corona radiata to the left lentiform nuclei. Mild mass effect has resolved. Developing encephalomalacia with some hemosiderin and/or laminar necrosis. No new restricted diffusion or evidence of acute infarction. No midline shift, mass effect, evidence of mass lesion, ventriculomegaly, extra-axial collection or acute intracranial hemorrhage. Cervicomedullary junction and pituitary are within normal limits. Stable gray and white matter signal outside of the evolving deep left infarct. Patchy nonspecific bilateral white matter T2 and FLAIR hyperintensity. No cortical encephalomalacia or other chronic cerebral blood products. Vascular: Major intracranial vascular flow voids are stable. Skull and upper cervical spine: Negative. Normal bone marrow signal. Sinuses/Orbits:  Stable and negative orbits soft tissues. Mild to moderate frontoethmoidal sinus mucosal thickening has not significantly changed. Other: Stable mild right mastoid effusion. Stable visualized internal auditory structures. Negative scalp soft tissues. MRA HEAD FINDINGS Stable antegrade flow in the posterior circulation with mildly dominant distal right vertebral artery. Patent vertebrobasilar junction and basilar artery. AICA, SCA and PCA origins remain normal. PCA branches remain normal. Posterior communicating arteries are diminutive or absent. Stable antegrade flow in both ICA siphons. Tortuous cervical ICAs partially re- demonstrated. Normal ophthalmic artery origins. No siphon stenosis. Normal carotid termini, MCA and ACA origins. Anterior communicating artery and visualized ACA branches are within normal limits. Left MCA M1 segment and left MCA bifurcation appear normal. There is minimal to mild irregularity in the mid right MCA M1 segment which appears stable (series 21, image 7). Otherwise the visualized bilateral MCA branches are within normal limits. IMPRESSION: 1. No new intracranial abnormality. Expected evolution of the December left basal ganglia/corona radiata infarct. 2. Stable and negative for age intracranial MRA. Electronically Signed   By: Odessa Fleming M.D.   On: 06/23/2016 11:34   Dg Chest Portable 1 View  Result Date: 06/22/2016 CLINICAL DATA:  Cough, altered mental status EXAM: PORTABLE CHEST 1 VIEW COMPARISON:  CT chest 10/29/2007 FINDINGS: The heart size and mediastinal contours are within normal limits. Both lungs are clear. The visualized skeletal structures are unremarkable. IMPRESSION: No active disease. Electronically Signed   By: Elige Ko   On: 06/22/2016 21:46   Mr Maxine Glenn Head/brain Wo Cm  Result Date: 06/23/2016 CLINICAL DATA:  83 year old female status post possible seizure. Suffered a left basal ganglia infarct last month. Initial encounter. EXAM: MRI HEAD WITHOUT CONTRAST MRA  HEAD WITHOUT CONTRAST TECHNIQUE: Multiplanar, multiecho pulse sequences of the brain and surrounding structures were obtained without intravenous contrast. Angiographic images of the head were obtained using MRA technique without contrast. COMPARISON:  Head CT without contrast 06/22/2016. Brain MRI and intracranial MRA 05/16/2016 FINDINGS: MRI HEAD FINDINGS Brain: Expected evolution of the D similar infarct extending from the left corona radiata to the left lentiform nuclei. Mild mass effect has resolved. Developing encephalomalacia with some hemosiderin and/or laminar necrosis. No new restricted diffusion or evidence of acute infarction. No midline shift, mass effect, evidence of mass lesion, ventriculomegaly, extra-axial collection or acute intracranial hemorrhage. Cervicomedullary junction and pituitary are within normal limits. Stable gray and white matter signal outside of the evolving deep left infarct. Patchy nonspecific bilateral white matter T2 and FLAIR hyperintensity.  No cortical encephalomalacia or other chronic cerebral blood products. Vascular: Major intracranial vascular flow voids are stable. Skull and upper cervical spine: Negative. Normal bone marrow signal. Sinuses/Orbits: Stable and negative orbits soft tissues. Mild to moderate frontoethmoidal sinus mucosal thickening has not significantly changed. Other: Stable mild right mastoid effusion. Stable visualized internal auditory structures. Negative scalp soft tissues. MRA HEAD FINDINGS Stable antegrade flow in the posterior circulation with mildly dominant distal right vertebral artery. Patent vertebrobasilar junction and basilar artery. AICA, SCA and PCA origins remain normal. PCA branches remain normal. Posterior communicating arteries are diminutive or absent. Stable antegrade flow in both ICA siphons. Tortuous cervical ICAs partially re- demonstrated. Normal ophthalmic artery origins. No siphon stenosis. Normal carotid termini, MCA and ACA  origins. Anterior communicating artery and visualized ACA branches are within normal limits. Left MCA M1 segment and left MCA bifurcation appear normal. There is minimal to mild irregularity in the mid right MCA M1 segment which appears stable (series 21, image 7). Otherwise the visualized bilateral MCA branches are within normal limits. IMPRESSION: 1. No new intracranial abnormality. Expected evolution of the December left basal ganglia/corona radiata infarct. 2. Stable and negative for age intracranial MRA. Electronically Signed   By: Odessa Fleming M.D.   On: 06/23/2016 11:34     Assessment/Plan: 83 year old female presenting with episodes of decreased awareness. Etiology unclear.  MRI of the brain performed and shows no acute changes.  No significant metabolic abnormalities noted.  Although seizure remains on the differential, EEG shows no epileptiform discharges (patient less than cooperative).  Can not say that these events are seizure but patient may benefit from an AED trial.  Family member in room not willing to consider this until [facility] spoken with.  She is out of [place] at a funeral today.    Recommendations: 1.  Keppra 500mg  BID.  No load required.   2.  Follow up with neurology on an outpatient basis.    Thana Farr, MD Neurology [phone] 06/23/2016, 2:31 PM

## 2016-06-23 NOTE — Evaluation (Signed)
Clinical/Bedside Swallow Evaluation Patient Details  Name: Rose Jones MRN: [id number] Date of Birth: 08-01-1925  Today's Date: 06/23/2016 Time: SLP Start Time (ACUTE ONLY): 0845 SLP Stop Time (ACUTE ONLY): 0945 SLP Time Calculation (min) (ACUTE ONLY): 60 min  Past Medical History:  Past Medical History:  Diagnosis Date  . Dementia   . Diabetes mellitus without complication (HCC)   . History of B-cell lymphoma   . Hypertension   . Seizures (HCC)    Past Surgical History:  Past Surgical History:  Procedure Laterality Date  . NECK SURGERY     HPI:  Pt is a 83 y.o. female w/ h/o Dementia, HTN, seizures, possible recent CVA, lymphoma, DM who presents with Fall and an episode of what sounds like seizure. Patient was recently seen here and found to have stroke. Since that time her daughter states that she's been having some episodes at home with some confusion, and some leg shaking. Her episode today was similar to that included bowel and bladder incontinence. When she arrived to the ED she was significantly confused. On this writer's interview she has improved cognitively quite a bit. Tele neurology saw the patient and felt the episode was likely a seizure, patient has a remote history of seizures. Pt does have a significant h/o Dementia per family which does impact her communication at times; behaviors.    Assessment / Plan / Recommendation Clinical Impression  Pt appeared to present w/ adequate toleration of diet w/ no overt s/s of aspiration noted - reduced risk for aspiration when following general aspiration precautions. Pt was given tray setup but then fed self food/drinks slowly and using small bites/sips the majority of the time. Oral phase appeared appropriate for all bolus management and clearing. Pt would benefit from a Dysphagia level 3 diet w/ thin liquids; general aspiration precautions. Pt DOES have her Pills CRUSHED in Puree as a Baseline - for easier swallowing.  No further  skilled ST services indicated at this time; NSG to reconsult as needed if any change in swallowing status.     Aspiration Risk   (reduced)    Diet Recommendation  dysphagia level 3 w/ thin liquids; general aspiration precautions; tray setup at all meals.  Medication Administration: Crushed with puree ((baseline))    Other  Recommendations Recommended Consults:  (Dietician f/u as needed) Oral Care Recommendations: Oral care BID;Staff/trained caregiver to provide oral care   Follow up Recommendations None      Frequency and Duration            Prognosis Prognosis for Safe Diet Advancement: Good Barriers to Reach Goals: Cognitive deficits      Swallow Study   General Date of Onset: 06/22/16 HPI: Pt is a 83 y.o. female w/ h/o Dementia, HTN, seizures, possible recent CVA, lymphoma, DM who presents with Fall and an episode of what sounds like seizure. Patient was recently seen here and found to have stroke. Since that time her daughter states that she's been having some episodes at home with some confusion, and some leg shaking. Her episode today was similar to that included bowel and bladder incontinence. When she arrived to the ED she was significantly confused. On this writer's interview she has improved cognitively quite a bit. Tele neurology saw the patient and felt the episode was likely a seizure, patient has a remote history of seizures. Pt does have a significant h/o Dementia per family which does impact her communication at times; behaviors.  Type of Study: Bedside Swallow Evaluation Previous  Swallow Assessment: recent admission Diet Prior to this Study: Dysphagia 3 (soft);Thin liquids (but [facility] Temperature Spikes Noted: No (wbc 8.7) Respiratory Status: Room air History of Recent Intubation: No Behavior/Cognition: Alert;Cooperative;Pleasant mood;Confused;Distractible;Requires cueing Oral Cavity Assessment: Within Functional Limits Oral  Care Completed by SLP: Recent completion by staff Oral Cavity - Dentition: Adequate natural dentition;Missing dentition (few) Vision: Functional for self-feeding Self-Feeding Abilities: Able to feed self;Needs set up Patient Positioning: Upright in bed Baseline Vocal Quality: Normal;Low vocal intensity (w/ few verbalization responses) Volitional Cough: Strong Volitional Swallow: Able to elicit    Oral/Motor/Sensory Function Overall Oral Motor/Sensory Function: Within functional limits (w/ bolus management)   Ice Chips Ice chips: Not tested   Thin Liquid Thin Liquid: Within functional limits Presentation: Cup;Self Fed (~6 ozs total)    Nectar Thick Nectar Thick Liquid: Not tested   Honey Thick Honey Thick Liquid: Not tested   Puree Puree: Within functional limits Presentation: Self Fed;Spoon (2 trials)   Solid   GO   Solid: Within functional limits (softened solids (egg omelette)) Presentation: Self Fed;Spoon (100% of omelette ) Other Comments: needs tougher foods cut; tray setup and prep         Jerilynn SomKatherine Watson, MS, CCC-SLP Watson,Katherine 06/23/2016,10:42 AM

## 2016-06-23 NOTE — ED Notes (Signed)
Report to [place], Charity fundraiserN.

## 2016-06-23 NOTE — ED Notes (Signed)
Transporting patient with EDT Rose Jones to room 123-1C

## 2016-06-23 NOTE — Evaluation (Signed)
Physical Therapy Evaluation Patient Details Name: Rose Jones MRN: [id number] DOB: 08-Feb-1926 Today's Date: 06/23/2016   History of Present Illness  83 y.o. female who was admitted mid December with a Basal Ganglia Infarct. PMHx includes: dementia, B cell Lymphoma, HTN, DM.  She went to rehab and has been doing HHPT, pt returns with seizure-like episode.  Clinical Impression  Pt is pleasantly confused t/o the session and though she can follow some realtime simple instructions she struggled with carry over.  Spent much of ambulation encouraging being up in walker appropriately (centered and with better posture) but encouraging increased scanning/awareness but as soon as verbal and tactile cues stopped she regressed to her "baseline."  Pt overall was near her new baseline, but is confused and has some R neglect making  gait training difficult.    Follow Up Recommendations Home health PT    Equipment Recommendations       Recommendations for Other Services       Precautions / Restrictions Precautions Precautions:  (seizure) Restrictions Weight Bearing Restrictions: No      Mobility  Bed Mobility Overal bed mobility: Modified Independent             General bed mobility comments: Pt needed some extra time and cuing, but was able to get to EOB w/o assist  Transfers Overall transfer level: Modified independent Equipment used: Rolling walker (2 wheeled)             General transfer comment: Pt is able to rise to standing with only minimal cuing and no direct assist.  Pt did not use appropriate hand placement despite a lot of cuing.   Ambulation/Gait Ambulation/Gait assistance: Min assist Ambulation Distance (Feet): 200 Feet Assistive device: Rolling walker (2 wheeled)       General Gait Details: Pt consistently not centered on the walker and shifted R.  She seemed to have some neglect with turning, looking, awareness to R.  She had no LOBs and was able to maintain  relatively consistent cadence (apparently near her post CVA baseline) but overall had no significant safety concerns other than basic directional awareness.  Stairs            Wheelchair Mobility    Modified Rankin (Stroke Patients Only)       Balance Overall balance assessment: Modified Independent                                           Pertinent Vitals/Pain Pain Assessment: No/denies pain    Home Living Family/patient expects to be discharged to:: Private residence Living Arrangements: Children Available Help at Discharge: Family;Available 24 hours/day Type of Home: House Home Access: Ramped entrance       Home Equipment: Walker - 2 wheels;Cane - single point      Prior Function Level of Independence: Needs assistance         Comments: apparently pt has been getting around fairly well since returning home from rehab but is confused and has been needing more assist     Hand Dominance        Extremity/Trunk Assessment   Upper Extremity Assessment Upper Extremity Assessment: Generalized weakness (R weaker than L, grossly 3+ to 4-/5 t/o)    Lower Extremity Assessment Lower Extremity Assessment: Generalized weakness (L grossly 4/5, R 3+ to 4-/5 t/o )       Communication   Communication:  Expressive difficulties  Cognition Arousal/Alertness: Awake/alert Behavior During Therapy: Flat affect Overall Cognitive Status: History of cognitive impairments - at baseline                 General Comments: family reports she is essentially at her new baseline since the CVA in December    General Comments      Exercises     Assessment/Plan    PT Assessment Patient needs continued PT services  PT Problem List Decreased activity tolerance;Decreased balance;Decreased safety awareness;Decreased knowledge of use of DME;Decreased cognition;Decreased coordination          PT Treatment Interventions      PT Goals (Current goals can  be found in the Care Plan section)  Acute Rehab PT Goals Patient Stated Goal: go home PT Goal Formulation: Patient unable to participate in goal setting Time For Goal Achievement: 07/07/16 Potential to Achieve Goals: Fair    Frequency Min 2X/week   Barriers to discharge        Co-evaluation               End of Session Equipment Utilized During Treatment: Gait belt Activity Tolerance:  (limited by confusion) Patient left: with chair alarm set;with call bell/phone within reach           Time: 1414-1441 PT Time Calculation (min) (ACUTE ONLY): 27 min   Charges:   PT Evaluation $PT Eval Low Complexity: 1 Procedure PT Treatments $Gait Training: 8-22 mins   PT G Codes:        Rose Jones, DPT 06/23/2016, 3:38 PM

## 2016-06-23 NOTE — Progress Notes (Signed)
PT Cancellation Note  Patient Details Name: Rose Jones MRN: [id number] DOB: November 26, 1925   Cancelled Treatment:    Reason Eval/Treat Not Completed: Patient at procedure or test/unavailable Attempted x2 this AM, pt unavailable.  Will attempt PT exam this afternoon.  Malachi ProGalen R Kammy Klett, DPT 06/23/2016, 12:49 PM

## 2016-06-23 NOTE — Progress Notes (Signed)
Discharge instructions given and went over with patient and daughter-in-law at bedside. All questions answered. Patient discharged home via wheelchair by nursing staff. Bo McclintockBrewer,Hadriel Northup S, RN

## 2016-06-24 LAB — HEMOGLOBIN A1C
HEMOGLOBIN A1C: 8.8 % — AB (ref 4.8–5.6)
MEAN PLASMA GLUCOSE: 206 mg/dL

## 2016-06-26 NOTE — Discharge Summary (Signed)
[facility] [place] [facility]   PATIENT NAME: Rose CrutchBeulah Catlin    MR#:  [id number]  DATE OF BIRTH:  Jun 15, 1925  DATE OF ADMISSION:  06/22/2016   ADMITTING PHYSICIAN: Oralia Manisavid Willis, MD  DATE OF DISCHARGE: 06/23/2016  5:50 PM  PRIMARY CARE PHYSICIAN: Barbette ReichmannHANDE,VISHWANATH, MD   ADMISSION DIAGNOSIS:  Altered mental status, unspecified altered mental status type [R41.82] DISCHARGE DIAGNOSIS:  Principal Problem:   Seizure (HCC) Active Problems:   CVA (cerebral vascular accident) (HCC)   HTN (hypertension)   Diabetes (HCC)   Dementia  SECONDARY DIAGNOSIS:   Past Medical History:  Diagnosis Date  . Dementia   . Diabetes mellitus without complication (HCC)   . History of B-cell lymphoma   . Hypertension   . Seizures [facility](HCC)    HOSPITAL COURSE:  Rose Jones is an 83 y.o. female with a history of stroke in December of 2017 who was discharged to rehab and just returned home on 1/9.  Since that time patient has had periods when she does not want to eat.  Has also had a spell lasting 2-3 days when she as not as functional, needing help with ambulation and ADL.  Patient gradually improved. Admitted with confusion, decreased responsiveness and bowel/bladder incontinence.  Patient stared into space.  Was brought in after episode lasted a couple of hours but after arrival patient seemed to return to baseline.  SOC evaluated patient and was concerned for seizure.    * Episodes of decreased awareness. Etiology unclear.  MRI of the brain performed and shows no acute changes.  No significant metabolic abnormalities noted.  Although seizure remains on the differential, EEG shows no epileptiform discharges (patient less than cooperative).  Can not say that these events are seizure but patient may benefit from an AED trial.  Family member in room not willing to consider this until [facility].  She is out of [place] at a funeral today. I tried to reach patient's POA but no luck.  After talking with patient and her family member in room decision was made to hold off AED at this time. Neuro recommended Keppra but as family and patient would like to d/w her HCPOA - will hold off starting meds and have her Follow up with neurology on an outpatient basis. They are in agreement.  DISCHARGE CONDITIONS:  stable CONSULTS OBTAINED:  Treatment Team:  Thana FarrLeslie Reynolds, MD DRUG ALLERGIES:   Allergies  Allergen Reactions  . Estrogens Other (See Comments)    Unknown    DISCHARGE MEDICATIONS:   Allergies as of 06/23/2016      Reactions   Estrogens Other (See Comments)   Unknown      Medication List    TAKE these medications   acetaminophen 325 MG tablet Commonly known as:  TYLENOL Take 2 tablets (650 mg total) by mouth every 6 (six) hours as needed for mild pain (or temp > 37.5 C (99.5 F)).   amLODipine 5 MG tablet Commonly known as:  NORVASC Take 1 tablet by mouth daily.   aspirin 81 MG EC tablet Take 1 tablet (81 mg total) by mouth daily.   atorvastatin 40 MG tablet Commonly known as:  LIPITOR Take 1 tablet (40 mg total) by mouth daily at 6 PM.   CALTRATE 600+D PO Take 1 tablet by mouth daily.   donepezil 5 MG tablet Commonly known as:  ARICEPT Take 5 mg by mouth daily.   glipiZIDE 5 MG tablet Commonly known as:  GLUCOTROL  Take 5 mg by mouth every evening.   glipiZIDE 10 MG tablet Commonly known as:  GLUCOTROL Take 10 mg by mouth daily before breakfast.   metFORMIN 500 MG tablet Commonly known as:  GLUCOPHAGE Take 1 tablet by mouth 2 (two) times daily.   senna-docusate 8.6-50 MG tablet Commonly known as:  Senokot-S Take 1 tablet by mouth at bedtime as needed for mild constipation or moderate constipation.   sertraline 25 MG tablet Commonly known as:  ZOLOFT Take 1 tablet by mouth daily.   vitamin B-12 1000 MCG tablet Commonly known as:  CYANOCOBALAMIN Take 1 tablet by mouth daily.   Vitamin D (Ergocalciferol) 50000 units Caps  capsule Commonly known as:  DRISDOL Take 1 capsule by mouth once a week.        DISCHARGE INSTRUCTIONS:   DIET:  Regular diet DISCHARGE CONDITION:  Good ACTIVITY:  Activity as tolerated OXYGEN:  Home Oxygen: No.  Oxygen Delivery: room air DISCHARGE LOCATION:  home   If you experience worsening of your admission symptoms, develop shortness of breath, life threatening emergency, suicidal or homicidal thoughts you must seek medical attention immediately by calling 911 or calling your MD immediately  if symptoms less severe.  You Must read complete instructions/literature along with all the possible adverse reactions/side effects for all the Medicines you take and that have been prescribed to you. Take any new Medicines after you have completely understood and accpet all the possible adverse reactions/side effects.   Please note  You were cared for by a hospitalist during your hospital stay. If you have any questions about your discharge medications or the care you received while you were in the hospital after you are discharged, you can call the unit and asked to speak with the hospitalist on call if the hospitalist that took care of you is not available. Once you are discharged, your primary care physician will handle any further medical issues. Please note that NO REFILLS for any discharge medications will be authorized once you are discharged, as it is imperative that you return to your primary care physician (or establish a relationship with a primary care physician if you do not have one) for your aftercare needs so that they can reassess your need for medications and monitor your lab values.    On the day of Discharge:  VITAL SIGNS:  Blood pressure (!) 152/56, pulse 63, temperature 98.1 F (36.7 C), temperature source Oral, resp. rate 16, height 5' (1.524 m), weight 61.5 kg (135 lb 9.6 oz), SpO2 92 %. PHYSICAL EXAMINATION:  GENERAL:  83 y.o.-year-old patient lying in the bed  with no acute distress.  EYES: Pupils equal, round, reactive to light and accommodation. No scleral icterus. Extraocular muscles intact.  HEENT: Head atraumatic, normocephalic. Oropharynx and nasopharynx clear.  NECK:  Supple, no jugular venous distention. No thyroid enlargement, no tenderness.  LUNGS: Normal breath sounds bilaterally, no wheezing, rales,rhonchi or crepitation. No use of accessory muscles of respiration.  CARDIOVASCULAR: S1, S2 normal. No murmurs, rubs, or gallops.  ABDOMEN: Soft, non-tender, non-distended. Bowel sounds present. No organomegaly or mass.  EXTREMITIES: No pedal edema, cyanosis, or clubbing.  NEUROLOGIC: Cranial nerves II through XII are intact. Muscle strength 5/5 in all extremities. Sensation intact. Gait not checked.  PSYCHIATRIC: The patient is alert and oriented x 3.  SKIN: No obvious rash, lesion, or ulcer.  DATA REVIEW:   CBC  Recent Labs Lab 06/23/16 0522  WBC 8.7  HGB 13.0  HCT 37.1  PLT 307  Chemistries   Recent Labs Lab 06/22/16 2010 06/23/16 0522  NA 137  --   K 3.8  --   CL 101  --   CO2 26  --   GLUCOSE 207*  --   BUN 19  --   CREATININE 0.90 0.53  CALCIUM 10.0  --   AST 23  --   ALT 19  --   ALKPHOS 63  --   BILITOT 0.6  --     Follow-up Information    HANDE,VISHWANATH, MD. Schedule an appointment as soon as possible for a visit in 1 week(s).   Specialty:  Internal Medicine Contact information: [address] [place] [address] [phone]        Durene Cal [facility], MD. Go on 06/29/2016.   Specialty:  Neurology Why:  as scheduled Contact information: [address] [facility] [place] [address] [phone]           Management plans discussed with the patient, family and they are in agreement.  CODE STATUS: DNR  TOTAL TIME TAKING CARE OF THIS PATIENT: 45 minutes.    Delfino Lovett M.D on 06/26/2016 at 2:25 PM  Between 7am to 6pm - Pager -  [phone]  After 6pm go to [email or web address] - Social research officer, government  [facility]  Office  [phone]  CC: Primary care physician; Barbette Reichmann, MD   Note: This dictation was prepared with Dragon dictation along with smaller phrase technology. Any transcriptional errors that result from this process are unintentional.

## 2016-06-27 DIAGNOSIS — Z7982 Long term (current) use of aspirin: Secondary | ICD-10-CM | POA: Diagnosis not present

## 2016-06-27 DIAGNOSIS — Z7984 Long term (current) use of oral hypoglycemic drugs: Secondary | ICD-10-CM | POA: Diagnosis not present

## 2016-06-27 DIAGNOSIS — F0390 Unspecified dementia without behavioral disturbance: Secondary | ICD-10-CM | POA: Diagnosis not present

## 2016-06-27 DIAGNOSIS — I10 Essential (primary) hypertension: Secondary | ICD-10-CM | POA: Diagnosis not present

## 2016-06-27 DIAGNOSIS — I69351 Hemiplegia and hemiparesis following cerebral infarction affecting right dominant side: Secondary | ICD-10-CM | POA: Diagnosis not present

## 2016-06-27 DIAGNOSIS — E119 Type 2 diabetes mellitus without complications: Secondary | ICD-10-CM | POA: Diagnosis not present

## 2016-06-27 DIAGNOSIS — I69320 Aphasia following cerebral infarction: Secondary | ICD-10-CM | POA: Diagnosis not present

## 2016-06-29 DIAGNOSIS — I69351 Hemiplegia and hemiparesis following cerebral infarction affecting right dominant side: Secondary | ICD-10-CM | POA: Diagnosis not present

## 2016-06-29 DIAGNOSIS — F0390 Unspecified dementia without behavioral disturbance: Secondary | ICD-10-CM | POA: Diagnosis not present

## 2016-06-29 DIAGNOSIS — R410 Disorientation, unspecified: Secondary | ICD-10-CM | POA: Diagnosis not present

## 2016-06-29 DIAGNOSIS — Z09 Encounter for follow-up examination after completed treatment for conditions other than malignant neoplasm: Secondary | ICD-10-CM | POA: Diagnosis not present

## 2016-06-29 DIAGNOSIS — R002 Palpitations: Secondary | ICD-10-CM | POA: Diagnosis not present

## 2016-06-29 DIAGNOSIS — E119 Type 2 diabetes mellitus without complications: Secondary | ICD-10-CM | POA: Diagnosis not present

## 2016-06-29 DIAGNOSIS — I10 Essential (primary) hypertension: Secondary | ICD-10-CM | POA: Diagnosis not present

## 2016-06-29 DIAGNOSIS — I639 Cerebral infarction, unspecified: Secondary | ICD-10-CM | POA: Diagnosis not present

## 2016-06-29 DIAGNOSIS — F0150 Vascular dementia without behavioral disturbance: Secondary | ICD-10-CM | POA: Diagnosis not present

## 2016-06-30 DIAGNOSIS — F0390 Unspecified dementia without behavioral disturbance: Secondary | ICD-10-CM | POA: Diagnosis not present

## 2016-06-30 DIAGNOSIS — Z7984 Long term (current) use of oral hypoglycemic drugs: Secondary | ICD-10-CM | POA: Diagnosis not present

## 2016-06-30 DIAGNOSIS — E119 Type 2 diabetes mellitus without complications: Secondary | ICD-10-CM | POA: Diagnosis not present

## 2016-06-30 DIAGNOSIS — I69351 Hemiplegia and hemiparesis following cerebral infarction affecting right dominant side: Secondary | ICD-10-CM | POA: Diagnosis not present

## 2016-06-30 DIAGNOSIS — I69320 Aphasia following cerebral infarction: Secondary | ICD-10-CM | POA: Diagnosis not present

## 2016-06-30 DIAGNOSIS — Z7982 Long term (current) use of aspirin: Secondary | ICD-10-CM | POA: Diagnosis not present

## 2016-06-30 DIAGNOSIS — I10 Essential (primary) hypertension: Secondary | ICD-10-CM | POA: Diagnosis not present

## 2016-07-04 DIAGNOSIS — F0390 Unspecified dementia without behavioral disturbance: Secondary | ICD-10-CM | POA: Diagnosis not present

## 2016-07-04 DIAGNOSIS — I69351 Hemiplegia and hemiparesis following cerebral infarction affecting right dominant side: Secondary | ICD-10-CM | POA: Diagnosis not present

## 2016-07-04 DIAGNOSIS — Z7982 Long term (current) use of aspirin: Secondary | ICD-10-CM | POA: Diagnosis not present

## 2016-07-04 DIAGNOSIS — I69320 Aphasia following cerebral infarction: Secondary | ICD-10-CM | POA: Diagnosis not present

## 2016-07-04 DIAGNOSIS — I10 Essential (primary) hypertension: Secondary | ICD-10-CM | POA: Diagnosis not present

## 2016-07-04 DIAGNOSIS — E119 Type 2 diabetes mellitus without complications: Secondary | ICD-10-CM | POA: Diagnosis not present

## 2016-07-04 DIAGNOSIS — Z7984 Long term (current) use of oral hypoglycemic drugs: Secondary | ICD-10-CM | POA: Diagnosis not present

## 2016-07-06 DIAGNOSIS — F0390 Unspecified dementia without behavioral disturbance: Secondary | ICD-10-CM | POA: Diagnosis not present

## 2016-07-06 DIAGNOSIS — E119 Type 2 diabetes mellitus without complications: Secondary | ICD-10-CM | POA: Diagnosis not present

## 2016-07-06 DIAGNOSIS — I10 Essential (primary) hypertension: Secondary | ICD-10-CM | POA: Diagnosis not present

## 2016-07-06 DIAGNOSIS — Z7982 Long term (current) use of aspirin: Secondary | ICD-10-CM | POA: Diagnosis not present

## 2016-07-06 DIAGNOSIS — I69320 Aphasia following cerebral infarction: Secondary | ICD-10-CM | POA: Diagnosis not present

## 2016-07-06 DIAGNOSIS — Z7984 Long term (current) use of oral hypoglycemic drugs: Secondary | ICD-10-CM | POA: Diagnosis not present

## 2016-07-06 DIAGNOSIS — I69351 Hemiplegia and hemiparesis following cerebral infarction affecting right dominant side: Secondary | ICD-10-CM | POA: Diagnosis not present

## 2016-07-06 NOTE — Progress Notes (Signed)
Late entry for missed G-code. Based on review of the evaluation and goals by this therapist. Katherine Watson, MS, CCC-SLP  

## 2016-07-08 DIAGNOSIS — Z7982 Long term (current) use of aspirin: Secondary | ICD-10-CM | POA: Diagnosis not present

## 2016-07-08 DIAGNOSIS — F0390 Unspecified dementia without behavioral disturbance: Secondary | ICD-10-CM | POA: Diagnosis not present

## 2016-07-08 DIAGNOSIS — I69320 Aphasia following cerebral infarction: Secondary | ICD-10-CM | POA: Diagnosis not present

## 2016-07-08 DIAGNOSIS — Z7984 Long term (current) use of oral hypoglycemic drugs: Secondary | ICD-10-CM | POA: Diagnosis not present

## 2016-07-08 DIAGNOSIS — E119 Type 2 diabetes mellitus without complications: Secondary | ICD-10-CM | POA: Diagnosis not present

## 2016-07-08 DIAGNOSIS — I69351 Hemiplegia and hemiparesis following cerebral infarction affecting right dominant side: Secondary | ICD-10-CM | POA: Diagnosis not present

## 2016-07-08 DIAGNOSIS — I10 Essential (primary) hypertension: Secondary | ICD-10-CM | POA: Diagnosis not present

## 2016-07-12 DIAGNOSIS — I10 Essential (primary) hypertension: Secondary | ICD-10-CM | POA: Diagnosis not present

## 2016-07-12 DIAGNOSIS — Z7984 Long term (current) use of oral hypoglycemic drugs: Secondary | ICD-10-CM | POA: Diagnosis not present

## 2016-07-12 DIAGNOSIS — E119 Type 2 diabetes mellitus without complications: Secondary | ICD-10-CM | POA: Diagnosis not present

## 2016-07-12 DIAGNOSIS — I69320 Aphasia following cerebral infarction: Secondary | ICD-10-CM | POA: Diagnosis not present

## 2016-07-12 DIAGNOSIS — Z7982 Long term (current) use of aspirin: Secondary | ICD-10-CM | POA: Diagnosis not present

## 2016-07-12 DIAGNOSIS — I69351 Hemiplegia and hemiparesis following cerebral infarction affecting right dominant side: Secondary | ICD-10-CM | POA: Diagnosis not present

## 2016-07-12 DIAGNOSIS — F0390 Unspecified dementia without behavioral disturbance: Secondary | ICD-10-CM | POA: Diagnosis not present

## 2016-07-14 DIAGNOSIS — R410 Disorientation, unspecified: Secondary | ICD-10-CM | POA: Diagnosis not present

## 2016-07-14 DIAGNOSIS — G40909 Epilepsy, unspecified, not intractable, without status epilepticus: Secondary | ICD-10-CM | POA: Diagnosis not present

## 2016-07-14 DIAGNOSIS — R002 Palpitations: Secondary | ICD-10-CM | POA: Diagnosis not present

## 2016-07-15 DIAGNOSIS — F0390 Unspecified dementia without behavioral disturbance: Secondary | ICD-10-CM | POA: Diagnosis not present

## 2016-07-15 DIAGNOSIS — I69320 Aphasia following cerebral infarction: Secondary | ICD-10-CM | POA: Diagnosis not present

## 2016-07-15 DIAGNOSIS — I69351 Hemiplegia and hemiparesis following cerebral infarction affecting right dominant side: Secondary | ICD-10-CM | POA: Diagnosis not present

## 2016-07-15 DIAGNOSIS — E119 Type 2 diabetes mellitus without complications: Secondary | ICD-10-CM | POA: Diagnosis not present

## 2016-07-15 DIAGNOSIS — I10 Essential (primary) hypertension: Secondary | ICD-10-CM | POA: Diagnosis not present

## 2016-07-15 DIAGNOSIS — Z7984 Long term (current) use of oral hypoglycemic drugs: Secondary | ICD-10-CM | POA: Diagnosis not present

## 2016-07-15 DIAGNOSIS — Z7982 Long term (current) use of aspirin: Secondary | ICD-10-CM | POA: Diagnosis not present

## 2016-07-19 DIAGNOSIS — F0390 Unspecified dementia without behavioral disturbance: Secondary | ICD-10-CM | POA: Diagnosis not present

## 2016-07-19 DIAGNOSIS — E119 Type 2 diabetes mellitus without complications: Secondary | ICD-10-CM | POA: Diagnosis not present

## 2016-07-19 DIAGNOSIS — I10 Essential (primary) hypertension: Secondary | ICD-10-CM | POA: Diagnosis not present

## 2016-07-19 DIAGNOSIS — I69351 Hemiplegia and hemiparesis following cerebral infarction affecting right dominant side: Secondary | ICD-10-CM | POA: Diagnosis not present

## 2016-07-19 DIAGNOSIS — I69320 Aphasia following cerebral infarction: Secondary | ICD-10-CM | POA: Diagnosis not present

## 2016-07-19 DIAGNOSIS — Z7984 Long term (current) use of oral hypoglycemic drugs: Secondary | ICD-10-CM | POA: Diagnosis not present

## 2016-07-19 DIAGNOSIS — Z7982 Long term (current) use of aspirin: Secondary | ICD-10-CM | POA: Diagnosis not present

## 2016-07-20 DIAGNOSIS — E119 Type 2 diabetes mellitus without complications: Secondary | ICD-10-CM | POA: Diagnosis not present

## 2016-07-20 DIAGNOSIS — I10 Essential (primary) hypertension: Secondary | ICD-10-CM | POA: Diagnosis not present

## 2016-07-20 DIAGNOSIS — I69351 Hemiplegia and hemiparesis following cerebral infarction affecting right dominant side: Secondary | ICD-10-CM | POA: Diagnosis not present

## 2016-07-20 DIAGNOSIS — Z7984 Long term (current) use of oral hypoglycemic drugs: Secondary | ICD-10-CM | POA: Diagnosis not present

## 2016-07-20 DIAGNOSIS — F0390 Unspecified dementia without behavioral disturbance: Secondary | ICD-10-CM | POA: Diagnosis not present

## 2016-07-20 DIAGNOSIS — Z7982 Long term (current) use of aspirin: Secondary | ICD-10-CM | POA: Diagnosis not present

## 2016-07-20 DIAGNOSIS — I69320 Aphasia following cerebral infarction: Secondary | ICD-10-CM | POA: Diagnosis not present

## 2016-07-21 DIAGNOSIS — I10 Essential (primary) hypertension: Secondary | ICD-10-CM | POA: Diagnosis not present

## 2016-07-21 DIAGNOSIS — I69320 Aphasia following cerebral infarction: Secondary | ICD-10-CM | POA: Diagnosis not present

## 2016-07-21 DIAGNOSIS — Z7984 Long term (current) use of oral hypoglycemic drugs: Secondary | ICD-10-CM | POA: Diagnosis not present

## 2016-07-21 DIAGNOSIS — F0390 Unspecified dementia without behavioral disturbance: Secondary | ICD-10-CM | POA: Diagnosis not present

## 2016-07-21 DIAGNOSIS — E119 Type 2 diabetes mellitus without complications: Secondary | ICD-10-CM | POA: Diagnosis not present

## 2016-07-21 DIAGNOSIS — I69351 Hemiplegia and hemiparesis following cerebral infarction affecting right dominant side: Secondary | ICD-10-CM | POA: Diagnosis not present

## 2016-07-21 DIAGNOSIS — Z7982 Long term (current) use of aspirin: Secondary | ICD-10-CM | POA: Diagnosis not present

## 2016-07-26 DIAGNOSIS — I10 Essential (primary) hypertension: Secondary | ICD-10-CM | POA: Diagnosis not present

## 2016-07-26 DIAGNOSIS — Z7982 Long term (current) use of aspirin: Secondary | ICD-10-CM | POA: Diagnosis not present

## 2016-07-26 DIAGNOSIS — I69351 Hemiplegia and hemiparesis following cerebral infarction affecting right dominant side: Secondary | ICD-10-CM | POA: Diagnosis not present

## 2016-07-26 DIAGNOSIS — E119 Type 2 diabetes mellitus without complications: Secondary | ICD-10-CM | POA: Diagnosis not present

## 2016-07-26 DIAGNOSIS — Z7984 Long term (current) use of oral hypoglycemic drugs: Secondary | ICD-10-CM | POA: Diagnosis not present

## 2016-07-26 DIAGNOSIS — I69320 Aphasia following cerebral infarction: Secondary | ICD-10-CM | POA: Diagnosis not present

## 2016-07-26 DIAGNOSIS — F0390 Unspecified dementia without behavioral disturbance: Secondary | ICD-10-CM | POA: Diagnosis not present

## 2016-07-28 DIAGNOSIS — F0280 Dementia in other diseases classified elsewhere without behavioral disturbance: Secondary | ICD-10-CM | POA: Diagnosis not present

## 2016-07-28 DIAGNOSIS — G309 Alzheimer's disease, unspecified: Secondary | ICD-10-CM | POA: Diagnosis not present

## 2016-07-28 DIAGNOSIS — F0150 Vascular dementia without behavioral disturbance: Secondary | ICD-10-CM | POA: Diagnosis not present

## 2016-07-28 DIAGNOSIS — R410 Disorientation, unspecified: Secondary | ICD-10-CM | POA: Diagnosis not present

## 2016-07-28 DIAGNOSIS — I63312 Cerebral infarction due to thrombosis of left middle cerebral artery: Secondary | ICD-10-CM | POA: Diagnosis not present

## 2016-08-24 DIAGNOSIS — F0150 Vascular dementia without behavioral disturbance: Secondary | ICD-10-CM | POA: Diagnosis not present

## 2016-08-24 DIAGNOSIS — E119 Type 2 diabetes mellitus without complications: Secondary | ICD-10-CM | POA: Diagnosis not present

## 2016-08-24 DIAGNOSIS — I10 Essential (primary) hypertension: Secondary | ICD-10-CM | POA: Diagnosis not present

## 2016-08-24 DIAGNOSIS — E7800 Pure hypercholesterolemia, unspecified: Secondary | ICD-10-CM | POA: Diagnosis not present

## 2016-08-24 DIAGNOSIS — R8290 Unspecified abnormal findings in urine: Secondary | ICD-10-CM | POA: Diagnosis not present

## 2016-08-31 DIAGNOSIS — I10 Essential (primary) hypertension: Secondary | ICD-10-CM | POA: Diagnosis not present

## 2016-08-31 DIAGNOSIS — R569 Unspecified convulsions: Secondary | ICD-10-CM | POA: Diagnosis not present

## 2016-08-31 DIAGNOSIS — F0390 Unspecified dementia without behavioral disturbance: Secondary | ICD-10-CM | POA: Diagnosis not present

## 2016-08-31 DIAGNOSIS — E119 Type 2 diabetes mellitus without complications: Secondary | ICD-10-CM | POA: Diagnosis not present

## 2016-08-31 DIAGNOSIS — I63312 Cerebral infarction due to thrombosis of left middle cerebral artery: Secondary | ICD-10-CM | POA: Diagnosis not present

## 2016-08-31 DIAGNOSIS — I69351 Hemiplegia and hemiparesis following cerebral infarction affecting right dominant side: Secondary | ICD-10-CM | POA: Diagnosis not present

## 2016-10-19 DIAGNOSIS — G309 Alzheimer's disease, unspecified: Secondary | ICD-10-CM | POA: Diagnosis not present

## 2016-10-19 DIAGNOSIS — R569 Unspecified convulsions: Secondary | ICD-10-CM | POA: Diagnosis not present

## 2016-10-19 DIAGNOSIS — F0150 Vascular dementia without behavioral disturbance: Secondary | ICD-10-CM | POA: Diagnosis not present

## 2016-10-19 DIAGNOSIS — Z8659 Personal history of other mental and behavioral disorders: Secondary | ICD-10-CM | POA: Diagnosis not present

## 2016-10-19 DIAGNOSIS — F0280 Dementia in other diseases classified elsewhere without behavioral disturbance: Secondary | ICD-10-CM | POA: Diagnosis not present

## 2016-10-19 DIAGNOSIS — Z8673 Personal history of transient ischemic attack (TIA), and cerebral infarction without residual deficits: Secondary | ICD-10-CM | POA: Diagnosis not present

## 2016-11-24 ENCOUNTER — Emergency Department
Admission: EM | Admit: 2016-11-24 | Discharge: 2016-11-25 | Disposition: A | Discharge status: Home / Self Care | Payer: Medicare HMO | Attending: Emergency Medicine | Admitting: Emergency Medicine

## 2016-11-24 ENCOUNTER — Emergency Department: Payer: Medicare HMO

## 2016-11-24 ENCOUNTER — Encounter: Payer: Self-pay | Admitting: Emergency Medicine

## 2016-11-24 DIAGNOSIS — W01190A Fall on same level from slipping, tripping and stumbling with subsequent striking against furniture, initial encounter: Secondary | ICD-10-CM | POA: Insufficient documentation

## 2016-11-24 DIAGNOSIS — S01312A Laceration without foreign body of left ear, initial encounter: Secondary | ICD-10-CM | POA: Insufficient documentation

## 2016-11-24 DIAGNOSIS — I10 Essential (primary) hypertension: Secondary | ICD-10-CM | POA: Diagnosis not present

## 2016-11-24 DIAGNOSIS — Z7982 Long term (current) use of aspirin: Secondary | ICD-10-CM | POA: Insufficient documentation

## 2016-11-24 DIAGNOSIS — Y9301 Activity, walking, marching and hiking: Secondary | ICD-10-CM | POA: Diagnosis not present

## 2016-11-24 DIAGNOSIS — Y929 Unspecified place or not applicable: Secondary | ICD-10-CM | POA: Insufficient documentation

## 2016-11-24 DIAGNOSIS — Y999 Unspecified external cause status: Secondary | ICD-10-CM | POA: Diagnosis not present

## 2016-11-24 DIAGNOSIS — S199XXA Unspecified injury of neck, initial encounter: Secondary | ICD-10-CM | POA: Diagnosis not present

## 2016-11-24 DIAGNOSIS — S0990XA Unspecified injury of head, initial encounter: Secondary | ICD-10-CM | POA: Diagnosis not present

## 2016-11-24 DIAGNOSIS — Z79899 Other long term (current) drug therapy: Secondary | ICD-10-CM | POA: Insufficient documentation

## 2016-11-24 DIAGNOSIS — S0181XA Laceration without foreign body of other part of head, initial encounter: Secondary | ICD-10-CM | POA: Insufficient documentation

## 2016-11-24 DIAGNOSIS — Z23 Encounter for immunization: Secondary | ICD-10-CM | POA: Diagnosis not present

## 2016-11-24 DIAGNOSIS — E119 Type 2 diabetes mellitus without complications: Secondary | ICD-10-CM | POA: Insufficient documentation

## 2016-11-24 DIAGNOSIS — S0003XA Contusion of scalp, initial encounter: Secondary | ICD-10-CM | POA: Diagnosis not present

## 2016-11-24 DIAGNOSIS — Z7984 Long term (current) use of oral hypoglycemic drugs: Secondary | ICD-10-CM | POA: Insufficient documentation

## 2016-11-24 DIAGNOSIS — E860 Dehydration: Secondary | ICD-10-CM

## 2016-11-24 LAB — BASIC METABOLIC PANEL
ANION GAP: 11 (ref 5–15)
BUN: 22 mg/dL — ABNORMAL HIGH (ref 6–20)
CALCIUM: 10.1 mg/dL (ref 8.9–10.3)
CHLORIDE: 104 mmol/L (ref 101–111)
CO2: 26 mmol/L (ref 22–32)
Creatinine, Ser: 0.71 mg/dL (ref 0.44–1.00)
GFR calc Af Amer: >60 mL/min (ref >60)
GFR calc non Af Amer: >60 mL/min (ref >60)
GLUCOSE: 196 mg/dL — AB (ref 65–99)
Potassium: 3.9 mmol/L (ref 3.5–5.1)
Sodium: 141 mmol/L (ref 135–145)

## 2016-11-24 LAB — URINALYSIS, COMPLETE (UACMP) WITH MICROSCOPIC
BACTERIA UA: NONE SEEN
Bilirubin Urine: NEGATIVE
Glucose, UA: 50 mg/dL — AB
Hgb urine dipstick: NEGATIVE
KETONES UR: 20 mg/dL — AB
Leukocytes, UA: NEGATIVE
Nitrite: NEGATIVE
PROTEIN: 30 mg/dL — AB
Specific Gravity, Urine: 1.017 (ref 1.005–1.030)
pH: 6 (ref 5.0–8.0)

## 2016-11-24 LAB — CBC
HEMATOCRIT: 44.2 % (ref 35.0–47.0)
HEMOGLOBIN: 14.9 g/dL (ref 12.0–16.0)
MCH: 31 pg (ref 26.0–34.0)
MCHC: 33.8 g/dL (ref 32.0–36.0)
MCV: 91.8 fL (ref 80.0–100.0)
Platelets: 260 10*3/uL (ref 150–440)
RBC: 4.81 MIL/uL (ref 3.80–5.20)
RDW: 14.6 % — ABNORMAL HIGH (ref 11.5–14.5)
WBC: 10.6 10*3/uL (ref 3.6–11.0)

## 2016-11-24 LAB — TROPONIN I

## 2016-11-24 MED ORDER — SODIUM CHLORIDE 0.9 % IV BOLUS (SEPSIS): 1000.0000 mL | Freq: Once | INTRAVENOUS | Status: DC

## 2016-11-24 MED ORDER — LIDOCAINE HCL (PF) 1 % IJ SOLN
INTRAMUSCULAR | Status: AC
  Filled 2016-11-24: qty 5

## 2016-11-24 MED ORDER — LIDOCAINE HCL 1 % IJ SOLN
5.0000 mL | Freq: Once | INTRAMUSCULAR | Status: AC
  Administered 2016-11-24: 5 mL via INTRADERMAL
  Filled 2016-11-24: qty 5

## 2016-11-24 MED ORDER — BACITRACIN ZINC 500 UNIT/GM EX OINT: TOPICAL_OINTMENT | Freq: Once | CUTANEOUS | Status: DC

## 2016-11-24 MED ORDER — TETANUS-DIPHTH-ACELL PERTUSSIS 5-2.5-18.5 LF-MCG/0.5 IM SUSP
0.5000 mL | Freq: Once | INTRAMUSCULAR | Status: AC
  Administered 2016-11-24: 0.5 mL via INTRAMUSCULAR
  Filled 2016-11-24: qty 0.5

## 2016-11-24 NOTE — ED Notes (Signed)
RN informed by patient family that patient ate full meal, and drank 32 ounces of fluid. RN informed MD Scotty CourtStafford. MD requested patient be ambulated. Pt able to ambulate at baseline. MD informed.

## 2016-11-24 NOTE — ED Notes (Signed)
Patient very weak during orthostatic vitals. MD Silverio LayYao informed.

## 2016-11-24 NOTE — ED Notes (Signed)
Reviewed d/c instructions, follow-up care, suture care, wound care with patient's family/caregiver. Pt family/caregiver verbalized understanding.   Patient's laceration has bled through dressing. This RN removed dressing to inspect laceration. Laceration continues to bleed slowly. RN informed patient's family she would inform MD.

## 2016-11-24 NOTE — ED Provider Notes (Signed)
 -----------------------------------------   11:25 PM on 11/24/2016 -----------------------------------------  Patient eating in the ED, orally hydrating due to inability to obtain a reliable IV for fluid infusion. Second troponin negative. CT head and neck negative. UA negative for any signs of infection. On repeat assessment, patient was able stand and walk at baseline. Will be discharged home with instructions to continue orally hydrating.   Sharman CheekStafford, Pranay Hilbun, MD 11/24/16 2326

## 2016-11-24 NOTE — ED Notes (Signed)
RN Racquel made 2 attempts at IV insertion. Second attempt successful, however IV subsequently blew when flushed by this RN. MD [facility] informed. MD ordered oral hydration and food. Patient given PO fluids, patient's family brought food from patient. RN will continue to monitor.

## 2016-11-24 NOTE — ED Notes (Signed)
Patient transported to CT 

## 2016-11-24 NOTE — ED Notes (Signed)
2 failed IV attempts by RN Eileen StanfordJenna, 2 failed IV insertion attempts by RN Iris. Racquel to bedside to attempt IV insertion

## 2016-11-24 NOTE — ED Notes (Signed)
ED Provider at bedside. 

## 2016-11-24 NOTE — ED Notes (Signed)
Performed in and out cath with [facility] RN

## 2016-11-24 NOTE — ED Provider Notes (Signed)
[facility]-EMERGENCY DEPARTMENT Provider Note   CSN: [id number] Arrival date & time: 11/24/16  1753     History   Chief Complaint Chief Complaint  Patient presents with  . Fall  . Head Injury    HPI Rose Jones is a 83 y.o. female hx of dementia, DM, previous lymphoma, HTN, seizure, here with fall. Patient normally walks with a walker but doesn't usually like to use it at home. She was walking and slipped and fell and hit her head on the corner of the table. She then had an episode where she had headaches and then may have passed out. Denies any seizure activity or incontinence. Family drove her from [place] here since she has been followed here. She was noted to have ear laceration that is bleeding. Unknown tdap.   The history is provided by the patient and a relative.    Past Medical History:  Diagnosis Date  . Dementia   . Diabetes mellitus without complication (HCC)   . History of B-cell lymphoma   . Hypertension   . Seizures [facility](HCC)     Patient Active Problem List   Diagnosis Date Noted  . Seizure (HCC) 06/22/2016  . HTN (hypertension) 06/22/2016  . Diabetes (HCC) 06/22/2016  . Dementia 06/22/2016  . CVA (cerebral vascular accident) (HCC) 05/15/2016    Past Surgical History:  Procedure Laterality Date  . NECK SURGERY      OB History    No data available       Home Medications    Prior to Admission medications   Medication Sig Start Date End Date Taking? Authorizing Provider  acetaminophen (TYLENOL) 325 MG tablet Take 2 tablets (650 mg total) by mouth every 6 (six) hours as needed for mild pain (or temp > 37.5 C (99.5 F)). 05/17/16   Gouru, Deanna ArtisAruna, MD  amLODipine (NORVASC) 5 MG tablet Take 1 tablet by mouth daily. 03/16/16   [provider]  aspirin EC 81 MG EC tablet Take 1 tablet (81 mg total) by mouth daily. 05/18/16   Ramonita LabGouru, Aruna, MD  atorvastatin (LIPITOR) 40 MG tablet Take 1 tablet (40 mg total) by mouth daily at 6 PM. 05/17/16   Gouru,  Aruna, MD  Calcium Carbonate-Vitamin D (CALTRATE 600+D PO) Take 1 tablet by mouth daily. 03/16/16   [provider]  donepezil (ARICEPT) 5 MG tablet Take 5 mg by mouth daily. 05/06/16   [provider]  glipiZIDE (GLUCOTROL) 10 MG tablet Take 10 mg by mouth daily before breakfast.  05/06/16   [provider]  glipiZIDE (GLUCOTROL) 5 MG tablet Take 5 mg by mouth every evening.    [provider]  metFORMIN (GLUCOPHAGE) 500 MG tablet Take 1 tablet by mouth 2 (two) times daily. 03/16/16   [provider]  senna-docusate (SENOKOT-S) 8.6-50 MG tablet Take 1 tablet by mouth at bedtime as needed for mild constipation or moderate constipation. 05/17/16   Ramonita LabGouru, Aruna, MD  sertraline (ZOLOFT) 25 MG tablet Take 1 tablet by mouth daily. 03/16/16   [provider]  vitamin B-12 (CYANOCOBALAMIN) 1000 MCG tablet Take 1 tablet by mouth daily.    [provider]  Vitamin D, Ergocalciferol, (DRISDOL) 50000 units CAPS capsule Take 1 capsule by mouth once a week. 04/15/15   [provider]    Family History Family History  Problem Relation Age of Onset  . Diabetes Father        Died at 7662  . Diabetes Sister     Social History  Social History  Substance Use Topics  . Smoking status: Never Smoker  . Smokeless tobacco: Never Used  . Alcohol use No     Allergies   Estrogens   Review of Systems Review of Systems  Skin: Positive for wound.  Neurological: Positive for syncope.  All other systems reviewed and are negative.    Physical Exam Updated Vital Signs BP (!) 164/71 (BP Location: Left Arm)   Pulse 61   Temp 98.1 F (36.7 C) (Oral)   Resp 18   Ht 4\' 10"  (1.473 m)   Wt 61.2 kg (135 lb)   SpO2 95%   BMI 28.22 kg/m   Physical Exam  Constitutional:  Chronically ill  HENT:  Head: Normocephalic.  L frontal hematoma with 0.5 cm laceration of L forehead. Complex laceration about 10 cm L helix of L ear that is  triangular shaped with cartilage that is loose. There is 2 cm laceration back of L ear and 2 cm laceration on the scalp behind L ear.   Eyes: EOM are normal. Pupils are equal, round, and reactive to light.  Neck: Normal range of motion.  Cardiovascular: Normal rate, regular rhythm and normal heart sounds.   Pulmonary/Chest: Effort normal and breath sounds normal.  Abdominal: Soft. Bowel sounds are normal. She exhibits no distension. There is no tenderness.  Musculoskeletal: Normal range of motion. She exhibits no edema or deformity.  Neurological: She is alert.  Strength 4/5 arms and legs   Skin: Skin is warm.  Psychiatric: She has a normal mood and affect.  Nursing note and vitals reviewed.    ED Treatments / Results  Labs (all labs ordered are listed, but only abnormal results are displayed) Labs Reviewed  BASIC METABOLIC PANEL - Abnormal; Notable for the following:       Result Value   Glucose, Bld 196 (*)    BUN 22 (*)    All other components within normal limits  CBC - Abnormal; Notable for the following:    RDW 14.6 (*)    All other components within normal limits  TROPONIN I  URINALYSIS, COMPLETE (UACMP) WITH MICROSCOPIC    EKG  EKG Interpretation  Date/Time:  Thursday November 24 2016 18:36:54 EDT Ventricular Rate:  68 PR Interval:    QRS Duration: 68 QT Interval:  406 QTC Calculation: 431 R Axis:   9 Text Interpretation:  Atrial flutter Low voltage QRS Abnormal ECG When compared with ECG of 22-Jun-2016 19:57, PREVIOUS ECG IS PRESENT No significant change since last tracing Confirmed by Richardean Canal [phone]) on 11/24/2016 6:56:52 PM       Radiology Ct Head Wo Contrast  Result Date: 11/24/2016 CLINICAL DATA:  Large left forehead hematoma and left ear laceration following a fall today. EXAM: CT HEAD WITHOUT CONTRAST TECHNIQUE: Contiguous axial images were obtained from the base of the skull through the vertex without intravenous contrast. COMPARISON:  Brain MR dated  06/23/2016 and head CT dated 06/22/2016. FINDINGS: Brain: Diffusely enlarged ventricles and subarachnoid spaces. Patchy white matter low density in both cerebral hemispheres. Old left basal ganglia infarct. No intracranial hemorrhage, mass lesion or CT evidence of acute infarction. Vascular: No hyperdense vessel or unexpected calcification. Skull: Normal. Negative for fracture or focal lesion. Sinuses/Orbits: Unremarkable. Other: Large left frontal scalp hematoma. IMPRESSION: 1. Large left frontal scalp hematoma without skull fracture or intracranial hemorrhage. 2. Stable atrophy and chronic small vessel white matter ischemic changes. 3. Old left basal ganglia infarct. Electronically Signed   By: Viviann Spare  Azucena Kuba M.D.   On: 11/24/2016 18:34    Procedures Procedures (including critical care time)  LACERATION REPAIR Performed by: Richardean Canal Authorized by: Richardean Canal Consent: Verbal consent obtained. Risks and benefits: risks, benefits and alternatives were discussed Consent given by: patient Patient identity confirmed: provided demographic data Prepped and Draped in normal sterile fashion Wound explored  Laceration Location: L ear  Laceration Length: 10 cm  No Foreign Bodies seen or palpated  Anesthesia: local infiltration  Local anesthetic: lidocaine 1% no epinephrine  Anesthetic total: 5 ml  Irrigation method: syringe Amount of cleaning: standard  Skin closure: 6-0 ethiolon  Number of sutures: 11  Technique: simple interrupted   Patient tolerance: Patient tolerated the procedure well with no immediate complications.  LACERATION REPAIR Performed by: Richardean Canal Authorized by: Richardean Canal Consent: Verbal consent obtained. Risks and benefits: risks, benefits and alternatives were discussed Consent given by: patient Patient identity confirmed: provided demographic data Prepped and Draped in normal sterile fashion Wound explored  Laceration Location: L frontal, L posterior  ear  Laceration Length: 1 cm  No Foreign Bodies seen or palpated  Anesthesia: none   Irrigation method: syringe Amount of cleaning: standard  Skin closure: dermabond   Patient tolerance: Patient tolerated the procedure well with no immediate complications.    Medications Ordered in ED Medications  lidocaine (PF) (XYLOCAINE) 1 % injection (not administered)  bacitracin ointment (not administered)  Tdap (BOOSTRIX) injection 0.5 mL (0.5 mLs Intramuscular Given 11/24/16 2004)  lidocaine (XYLOCAINE) 1 % (with pres) injection 5 mL (5 mLs Intradermal Given by Other 11/24/16 1919)     Initial Impression / Assessment and Plan / ED Course  I have reviewed the triage vital signs and the nursing notes.  Pertinent labs & imaging results that were available during my care of the patient were reviewed by me and considered in my medical decision making (see chart for details).     Katherleen Folkes is a 83 y.o. female here with fall. Mechanical fall but family didn't witness it. ? Syncope afterwards. Will get CT head to r/o bleed. Will get labs, UA. Patient walks with walker at baseline so will try and get orthostatics as well.   8:36 PM L ear laceration sutured. Dermabond the smaller lacerations L forehead and behind L ear. Nursing attempted to stand patient up and she required 2 people to assist. Will check UA, give IVF. Will get CT neck as well. Signed out to Dr. Scotty Court. If UA and CT neck unremarkable, anticipate delta trop given possible syncope.    Final Clinical Impressions(s) / ED Diagnoses   Final diagnoses:  None    New Prescriptions New Prescriptions   No medications on file     Charlynne Pander, MD 11/24/16 2039

## 2016-11-24 NOTE — ED Triage Notes (Addendum)
Patient presents to the ED post fall around 4:40pm.  Patient has a large hematoma to her left forehead and a laceration to her left ear.  Patient reports feeling weak and then passing out.  Family reports post fall patient was, "in and out" for awhile.  At this point family states patient is at baseline mentation.  Patient is oriented to self only at this time.  Patient denies any pain other than her head.  Patient does not take blood thinners other than a baby aspirin daily.

## 2016-11-25 MED ORDER — ACETAMINOPHEN 325 MG PO TABS
650.0000 mg | ORAL_TABLET | Freq: Once | ORAL | Status: AC
  Administered 2016-11-25: 650 mg via ORAL
  Filled 2016-11-25: qty 2

## 2016-11-25 NOTE — ED Notes (Signed)
MD Dolores FrameSung to bedside

## 2016-11-25 NOTE — ED Notes (Signed)
This RN redressed patient's wound with sterile gauze and coban per MD Dolores FrameSung order

## 2016-12-02 DIAGNOSIS — Z8673 Personal history of transient ischemic attack (TIA), and cerebral infarction without residual deficits: Secondary | ICD-10-CM | POA: Diagnosis not present

## 2016-12-02 DIAGNOSIS — F0150 Vascular dementia without behavioral disturbance: Secondary | ICD-10-CM | POA: Diagnosis not present

## 2016-12-02 DIAGNOSIS — G309 Alzheimer's disease, unspecified: Secondary | ICD-10-CM | POA: Diagnosis not present

## 2016-12-02 DIAGNOSIS — R410 Disorientation, unspecified: Secondary | ICD-10-CM | POA: Diagnosis not present

## 2016-12-02 DIAGNOSIS — F0280 Dementia in other diseases classified elsewhere without behavioral disturbance: Secondary | ICD-10-CM | POA: Diagnosis not present

## 2016-12-02 DIAGNOSIS — Z8659 Personal history of other mental and behavioral disorders: Secondary | ICD-10-CM | POA: Diagnosis not present

## 2016-12-06 DIAGNOSIS — E119 Type 2 diabetes mellitus without complications: Secondary | ICD-10-CM | POA: Diagnosis not present

## 2016-12-06 DIAGNOSIS — R7989 Other specified abnormal findings of blood chemistry: Secondary | ICD-10-CM | POA: Diagnosis not present

## 2016-12-06 DIAGNOSIS — F0390 Unspecified dementia without behavioral disturbance: Secondary | ICD-10-CM | POA: Diagnosis not present

## 2016-12-06 DIAGNOSIS — Z4802 Encounter for removal of sutures: Secondary | ICD-10-CM | POA: Diagnosis not present

## 2016-12-06 DIAGNOSIS — I10 Essential (primary) hypertension: Secondary | ICD-10-CM | POA: Diagnosis not present

## 2016-12-21 ENCOUNTER — Other Ambulatory Visit (HOSPITAL_COMMUNITY): Payer: Self-pay | Admitting: Internal Medicine

## 2016-12-21 ENCOUNTER — Other Ambulatory Visit: Payer: Self-pay | Admitting: Internal Medicine

## 2016-12-21 DIAGNOSIS — Z9181 History of falling: Secondary | ICD-10-CM

## 2016-12-22 ENCOUNTER — Ambulatory Visit (HOSPITAL_COMMUNITY)
Admission: RE | Admit: 2016-12-22 | Discharge: 2016-12-22 | Disposition: A | Discharge status: Home / Self Care | Payer: Medicare HMO | Source: Ambulatory Visit | Attending: Internal Medicine | Admitting: Internal Medicine

## 2016-12-22 DIAGNOSIS — I6782 Cerebral ischemia: Secondary | ICD-10-CM | POA: Insufficient documentation

## 2016-12-22 DIAGNOSIS — E119 Type 2 diabetes mellitus without complications: Secondary | ICD-10-CM | POA: Diagnosis not present

## 2016-12-22 DIAGNOSIS — Z9181 History of falling: Secondary | ICD-10-CM | POA: Diagnosis not present

## 2016-12-22 DIAGNOSIS — S0990XA Unspecified injury of head, initial encounter: Secondary | ICD-10-CM | POA: Diagnosis not present

## 2016-12-22 DIAGNOSIS — R51 Headache: Secondary | ICD-10-CM | POA: Diagnosis not present

## 2016-12-22 DIAGNOSIS — I639 Cerebral infarction, unspecified: Secondary | ICD-10-CM | POA: Insufficient documentation

## 2016-12-26 ENCOUNTER — Ambulatory Visit (HOSPITAL_COMMUNITY): Payer: Medicare HMO

## 2016-12-28 ENCOUNTER — Emergency Department: Payer: Medicare HMO

## 2016-12-28 ENCOUNTER — Encounter: Payer: Self-pay | Admitting: Emergency Medicine

## 2016-12-28 DIAGNOSIS — I10 Essential (primary) hypertension: Secondary | ICD-10-CM | POA: Insufficient documentation

## 2016-12-28 DIAGNOSIS — Y999 Unspecified external cause status: Secondary | ICD-10-CM | POA: Diagnosis not present

## 2016-12-28 DIAGNOSIS — E119 Type 2 diabetes mellitus without complications: Secondary | ICD-10-CM | POA: Diagnosis not present

## 2016-12-28 DIAGNOSIS — M79644 Pain in right finger(s): Secondary | ICD-10-CM | POA: Diagnosis present

## 2016-12-28 DIAGNOSIS — Z7984 Long term (current) use of oral hypoglycemic drugs: Secondary | ICD-10-CM | POA: Insufficient documentation

## 2016-12-28 DIAGNOSIS — S62101A Fracture of unspecified carpal bone, right wrist, initial encounter for closed fracture: Secondary | ICD-10-CM | POA: Insufficient documentation

## 2016-12-28 DIAGNOSIS — Z7982 Long term (current) use of aspirin: Secondary | ICD-10-CM | POA: Diagnosis not present

## 2016-12-28 DIAGNOSIS — Z79899 Other long term (current) drug therapy: Secondary | ICD-10-CM | POA: Diagnosis not present

## 2016-12-28 DIAGNOSIS — Y929 Unspecified place or not applicable: Secondary | ICD-10-CM | POA: Diagnosis not present

## 2016-12-28 DIAGNOSIS — X58XXXA Exposure to other specified factors, initial encounter: Secondary | ICD-10-CM | POA: Insufficient documentation

## 2016-12-28 DIAGNOSIS — Y939 Activity, unspecified: Secondary | ICD-10-CM | POA: Diagnosis not present

## 2016-12-28 DIAGNOSIS — R600 Localized edema: Secondary | ICD-10-CM | POA: Diagnosis not present

## 2016-12-28 DIAGNOSIS — M7989 Other specified soft tissue disorders: Secondary | ICD-10-CM | POA: Diagnosis not present

## 2016-12-28 NOTE — ED Triage Notes (Addendum)
Pt to triage via w/c with no distress noted; family reports noted knot to right hand with no known injury; daughter reports swelling to arm/hand/fingers and is concerned of possible blood clot; +radial pulse, brisk cap refill, W&D; pt moans with movement of arm but unable to verbalize exact location of pain; no other c/o at this time

## 2016-12-29 ENCOUNTER — Emergency Department: Payer: Medicare HMO

## 2016-12-29 ENCOUNTER — Emergency Department
Admission: EM | Admit: 2016-12-29 | Discharge: 2016-12-29 | Disposition: A | Discharge status: Home / Self Care | Payer: Medicare HMO | Attending: Student in an Organized Health Care Education/Training Program | Admitting: Student in an Organized Health Care Education/Training Program

## 2016-12-29 DIAGNOSIS — S62101A Fracture of unspecified carpal bone, right wrist, initial encounter for closed fracture: Secondary | ICD-10-CM

## 2016-12-29 DIAGNOSIS — R609 Edema, unspecified: Secondary | ICD-10-CM

## 2016-12-29 DIAGNOSIS — M7989 Other specified soft tissue disorders: Secondary | ICD-10-CM | POA: Diagnosis not present

## 2016-12-29 MED ORDER — ACETAMINOPHEN 325 MG PO TABS
650.0000 mg | ORAL_TABLET | Freq: Once | ORAL | Status: AC
  Administered 2016-12-29: 650 mg via ORAL
  Filled 2016-12-29: qty 2

## 2016-12-29 MED ORDER — AMLODIPINE BESYLATE 5 MG PO TABS
5.0000 mg | ORAL_TABLET | Freq: Once | ORAL | Status: AC
  Administered 2016-12-29: 5 mg via ORAL
  Filled 2016-12-29: qty 1

## 2016-12-29 NOTE — Discharge Instructions (Signed)
Keep arm elevated.  Follow up with Primary care physician this week.  Return immediately for worsening pain, fevers, or for any other concern.

## 2016-12-29 NOTE — ED Provider Notes (Signed)
[facility] Emergency Department Provider Note    First MD Initiated Contact with Patient 12/29/16 0103     (approximate)  I have reviewed the triage vital signs and the nursing notes.   HISTORY  Chief Complaint Hand Problem    HPI Rose Jones is a 83 y.o. female presents with chief complaint of pain and swelling of the right upper extremity. No measured fevers. No history of trauma. Patient does have a history of dementia. History is limited from the patient about family members at bedside said that she was wincing and discomfort and does have some bruising noted to the dorsal aspect of her right hand.   Past Medical History:  Diagnosis Date  . Dementia   . Diabetes mellitus without complication (HCC)   . History of B-cell lymphoma   . Hypertension   . Seizures (HCC)    Family History  Problem Relation Age of Onset  . Diabetes Father        Died at 4862  . Diabetes Sister    Past Surgical History:  Procedure Laterality Date  . NECK SURGERY     Patient Active Problem List   Diagnosis Date Noted  . Seizure (HCC) 06/22/2016  . HTN (hypertension) 06/22/2016  . Diabetes (HCC) 06/22/2016  . Dementia 06/22/2016  . CVA (cerebral vascular accident) (HCC) 05/15/2016      Prior to Admission medications   Medication Sig Start Date End Date Taking? Authorizing Provider  acetaminophen (TYLENOL) 325 MG tablet Take 2 tablets (650 mg total) by mouth every 6 (six) hours as needed for mild pain (or temp > 37.5 C (99.5 F)). 05/17/16   Gouru, Deanna ArtisAruna, MD  amLODipine (NORVASC) 5 MG tablet Take 1 tablet by mouth daily. 03/16/16   [provider]  aspirin EC 81 MG EC tablet Take 1 tablet (81 mg total) by mouth daily. 05/18/16   Ramonita LabGouru, Aruna, MD  atorvastatin (LIPITOR) 40 MG tablet Take 1 tablet (40 mg total) by mouth daily at 6 PM. 05/17/16   Gouru, Aruna, MD  Calcium Carbonate-Vitamin D (CALTRATE 600+D PO) Take 1 tablet by mouth daily. 03/16/16    [provider]  donepezil (ARICEPT) 5 MG tablet Take 5 mg by mouth daily. 05/06/16   [provider]  glipiZIDE (GLUCOTROL) 10 MG tablet Take 10 mg by mouth daily before breakfast.  05/06/16   [provider]  glipiZIDE (GLUCOTROL) 5 MG tablet Take 5 mg by mouth every evening.    [provider]  metFORMIN (GLUCOPHAGE) 500 MG tablet Take 1 tablet by mouth 2 (two) times daily. 03/16/16   [provider]  senna-docusate (SENOKOT-S) 8.6-50 MG tablet Take 1 tablet by mouth at bedtime as needed for mild constipation or moderate constipation. 05/17/16   Ramonita LabGouru, Aruna, MD  sertraline (ZOLOFT) 25 MG tablet Take 1 tablet by mouth daily. 03/16/16   [provider]  vitamin B-12 (CYANOCOBALAMIN) 1000 MCG tablet Take 1 tablet by mouth daily.    [provider]  Vitamin D, Ergocalciferol, (DRISDOL) 50000 units CAPS capsule Take 1 capsule by mouth once a week. 04/15/15   [provider]    Allergies Estrogens    Social History Social History  Substance Use Topics  . Smoking status: Never Smoker  . Smokeless tobacco: Never Used  . Alcohol use No    Review of Systems Patient denies headaches, rhinorrhea, blurry vision, numbness, shortness of breath, chest pain, edema, cough, abdominal pain, nausea, vomiting, diarrhea, dysuria, fevers, rashes  or hallucinations unless otherwise stated above in HPI. ____________________________________________   PHYSICAL EXAM:  VITAL SIGNS: Vitals:   12/29/16 0200 12/29/16 0224  BP: 140/75 139/65  Pulse:  81  Resp:  18  Temp:      Constitutional: Alert and oriented frail but in NAD Eyes: Conjunctivae are normal.  Head: Atraumatic. Nose: No congestion/rhinnorhea. Mouth/Throat: Mucous membranes are moist.   Neck: No stridor. Painless ROM.  Cardiovascular: Normal rate, regular rhythm. Grossly normal heart sounds.  Good peripheral circulation. Respiratory: Normal respiratory effort.  No  retractions. Lungs CTAB. Gastrointestinal: Soft and nontender. No distention. No abdominal bruits. No CVA tenderness. Musculoskeletal: ecchymosis to dorsal aspect of right hand, painless passive rom, no effusion, no warmth or erythema, no pain of elbow or shoulder No lower extremity tenderness nor edema.  No joint effusions. Neurologic:   No gross focal neurologic deficits are appreciated. No facial droop Skin:  Skin is warm, dry and intact. No rash noted. Psychiatric: Mood and affect are normal. Speech and behavior are normal.  ____________________________________________   LABS (all labs ordered are listed, but only abnormal results are displayed)  No results found for this or any previous visit (from the past 24 hour(s)). ____________________________________________  EKG____________________________________________  RADIOLOGY  .I personally reviewed all radiographic images ordered to evaluate for the above acute complaints and reviewed radiology reports and findings.  These findings were personally discussed with the patient.  Please see medical record for radiology report.   ____________________________________________   PROCEDURES  Procedure(s) performed:  Procedures    Critical Care performed: o ____________________________________________   INITIAL IMPRESSION / ASSESSMENT AND PLAN / ED COURSE  Pertinent labs & imaging results that were available during my care of the patient were reviewed by me and considered in my medical decision making (see chart for details).  DDX: dvt, contusion, arthritis, unlikely infection  Rose Jones is a 83 y.o. who presents to the ED with right wrist pain. Denies any other injuries. Denies motor or sensory loss. Denies paresthesias. VSS in ED. Exam as above. NV intact throughout and distal to injury. Cap refill <2 seconds. Pt able to range joint. TTP at wrist but no anatomical snuffbox tenderness. No clinical suspicion for infectious  process or septic joint. More consistent with contusion/sprain/fracture.   Clinical Course as of Dec 29 332  Thu Dec 29, 2016  0158 Patient with 2 small probable avulsion fractures on radiograph. This is consistent with the patient's pain. Mechanism of injury is uncertain. Patient remains dynamically stable. This provided that she is appropriate for follow-up with her PCP.  [PR]    Clinical Course User Index [PR] Willy Eddyobinson, Charissa Knowles, MD   Discussed supportive care, wrist splint, and follow up with pt.  ____________________________________________   FINAL CLINICAL IMPRESSION(S) / ED DIAGNOSES  Final diagnoses:  Swelling  Avulsion fracture of right wrist      NEW MEDICATIONS STARTED DURING THIS VISIT:  Discharge Medication List as of 12/29/2016  2:02 AM       Note:  This document was prepared using Dragon voice recognition software and may include unintentional dictation errors.    Willy Eddyobinson, Kainalu Heggs, MD 12/29/16 [phone]

## 2017-01-06 DIAGNOSIS — G40019 Localization-related (focal) (partial) idiopathic epilepsy and epileptic syndromes with seizures of localized onset, intractable, without status epilepticus: Secondary | ICD-10-CM | POA: Diagnosis not present

## 2017-01-07 DIAGNOSIS — G40019 Localization-related (focal) (partial) idiopathic epilepsy and epileptic syndromes with seizures of localized onset, intractable, without status epilepticus: Secondary | ICD-10-CM | POA: Diagnosis not present

## 2017-01-08 DIAGNOSIS — G40019 Localization-related (focal) (partial) idiopathic epilepsy and epileptic syndromes with seizures of localized onset, intractable, without status epilepticus: Secondary | ICD-10-CM | POA: Diagnosis not present

## 2017-01-10 DIAGNOSIS — R32 Unspecified urinary incontinence: Secondary | ICD-10-CM | POA: Diagnosis not present

## 2017-01-10 DIAGNOSIS — E119 Type 2 diabetes mellitus without complications: Secondary | ICD-10-CM | POA: Diagnosis not present

## 2017-01-10 DIAGNOSIS — I10 Essential (primary) hypertension: Secondary | ICD-10-CM | POA: Diagnosis not present

## 2017-01-10 DIAGNOSIS — F0150 Vascular dementia without behavioral disturbance: Secondary | ICD-10-CM | POA: Diagnosis not present

## 2017-01-10 DIAGNOSIS — I69351 Hemiplegia and hemiparesis following cerebral infarction affecting right dominant side: Secondary | ICD-10-CM | POA: Diagnosis not present

## 2017-01-10 DIAGNOSIS — Z8781 Personal history of (healed) traumatic fracture: Secondary | ICD-10-CM | POA: Diagnosis not present

## 2017-01-10 DIAGNOSIS — I63312 Cerebral infarction due to thrombosis of left middle cerebral artery: Secondary | ICD-10-CM | POA: Diagnosis not present

## 2017-01-10 DIAGNOSIS — R159 Full incontinence of feces: Secondary | ICD-10-CM | POA: Diagnosis not present

## 2017-03-10 DIAGNOSIS — F0280 Dementia in other diseases classified elsewhere without behavioral disturbance: Secondary | ICD-10-CM | POA: Diagnosis not present

## 2017-03-10 DIAGNOSIS — F0150 Vascular dementia without behavioral disturbance: Secondary | ICD-10-CM | POA: Diagnosis not present

## 2017-03-10 DIAGNOSIS — R569 Unspecified convulsions: Secondary | ICD-10-CM | POA: Diagnosis not present

## 2017-03-10 DIAGNOSIS — G309 Alzheimer's disease, unspecified: Secondary | ICD-10-CM | POA: Diagnosis not present

## 2017-03-23 DIAGNOSIS — G40909 Epilepsy, unspecified, not intractable, without status epilepticus: Secondary | ICD-10-CM | POA: Diagnosis not present

## 2017-03-23 DIAGNOSIS — I10 Essential (primary) hypertension: Secondary | ICD-10-CM | POA: Diagnosis not present

## 2017-03-23 DIAGNOSIS — I69351 Hemiplegia and hemiparesis following cerebral infarction affecting right dominant side: Secondary | ICD-10-CM | POA: Diagnosis not present

## 2017-03-23 DIAGNOSIS — E559 Vitamin D deficiency, unspecified: Secondary | ICD-10-CM | POA: Diagnosis not present

## 2017-03-23 DIAGNOSIS — I69328 Other speech and language deficits following cerebral infarction: Secondary | ICD-10-CM | POA: Diagnosis not present

## 2017-03-23 DIAGNOSIS — E119 Type 2 diabetes mellitus without complications: Secondary | ICD-10-CM | POA: Diagnosis not present

## 2017-03-23 DIAGNOSIS — K859 Acute pancreatitis without necrosis or infection, unspecified: Secondary | ICD-10-CM | POA: Diagnosis not present

## 2017-03-23 DIAGNOSIS — F329 Major depressive disorder, single episode, unspecified: Secondary | ICD-10-CM | POA: Diagnosis not present

## 2017-03-23 DIAGNOSIS — F0390 Unspecified dementia without behavioral disturbance: Secondary | ICD-10-CM | POA: Diagnosis not present

## 2017-03-27 DIAGNOSIS — F0390 Unspecified dementia without behavioral disturbance: Secondary | ICD-10-CM | POA: Diagnosis not present

## 2017-03-27 DIAGNOSIS — E119 Type 2 diabetes mellitus without complications: Secondary | ICD-10-CM | POA: Diagnosis not present

## 2017-03-27 DIAGNOSIS — F329 Major depressive disorder, single episode, unspecified: Secondary | ICD-10-CM | POA: Diagnosis not present

## 2017-03-27 DIAGNOSIS — K859 Acute pancreatitis without necrosis or infection, unspecified: Secondary | ICD-10-CM | POA: Diagnosis not present

## 2017-03-27 DIAGNOSIS — I69328 Other speech and language deficits following cerebral infarction: Secondary | ICD-10-CM | POA: Diagnosis not present

## 2017-03-27 DIAGNOSIS — G40909 Epilepsy, unspecified, not intractable, without status epilepticus: Secondary | ICD-10-CM | POA: Diagnosis not present

## 2017-03-27 DIAGNOSIS — I69351 Hemiplegia and hemiparesis following cerebral infarction affecting right dominant side: Secondary | ICD-10-CM | POA: Diagnosis not present

## 2017-03-27 DIAGNOSIS — I10 Essential (primary) hypertension: Secondary | ICD-10-CM | POA: Diagnosis not present

## 2017-03-27 DIAGNOSIS — E559 Vitamin D deficiency, unspecified: Secondary | ICD-10-CM | POA: Diagnosis not present

## 2017-03-28 DIAGNOSIS — E119 Type 2 diabetes mellitus without complications: Secondary | ICD-10-CM | POA: Diagnosis not present

## 2017-03-28 DIAGNOSIS — G40909 Epilepsy, unspecified, not intractable, without status epilepticus: Secondary | ICD-10-CM | POA: Diagnosis not present

## 2017-03-28 DIAGNOSIS — I69351 Hemiplegia and hemiparesis following cerebral infarction affecting right dominant side: Secondary | ICD-10-CM | POA: Diagnosis not present

## 2017-03-28 DIAGNOSIS — I69328 Other speech and language deficits following cerebral infarction: Secondary | ICD-10-CM | POA: Diagnosis not present

## 2017-03-28 DIAGNOSIS — E559 Vitamin D deficiency, unspecified: Secondary | ICD-10-CM | POA: Diagnosis not present

## 2017-03-28 DIAGNOSIS — F329 Major depressive disorder, single episode, unspecified: Secondary | ICD-10-CM | POA: Diagnosis not present

## 2017-03-28 DIAGNOSIS — K859 Acute pancreatitis without necrosis or infection, unspecified: Secondary | ICD-10-CM | POA: Diagnosis not present

## 2017-03-28 DIAGNOSIS — I10 Essential (primary) hypertension: Secondary | ICD-10-CM | POA: Diagnosis not present

## 2017-03-28 DIAGNOSIS — F0390 Unspecified dementia without behavioral disturbance: Secondary | ICD-10-CM | POA: Diagnosis not present

## 2017-03-29 ENCOUNTER — Inpatient Hospital Stay
Admission: EM | Admit: 2017-03-29 | Discharge: 2017-03-31 | DRG: 690 | Disposition: A | Discharge status: Home Health | Payer: Medicare HMO | Attending: Internal Medicine | Admitting: Internal Medicine

## 2017-03-29 ENCOUNTER — Emergency Department: Payer: Medicare HMO

## 2017-03-29 ENCOUNTER — Encounter: Payer: Self-pay | Admitting: Emergency Medicine

## 2017-03-29 DIAGNOSIS — R4182 Altered mental status, unspecified: Secondary | ICD-10-CM | POA: Diagnosis present

## 2017-03-29 DIAGNOSIS — N390 Urinary tract infection, site not specified: Secondary | ICD-10-CM | POA: Diagnosis not present

## 2017-03-29 DIAGNOSIS — A419 Sepsis, unspecified organism: Secondary | ICD-10-CM | POA: Diagnosis not present

## 2017-03-29 DIAGNOSIS — E876 Hypokalemia: Secondary | ICD-10-CM | POA: Diagnosis not present

## 2017-03-29 DIAGNOSIS — E119 Type 2 diabetes mellitus without complications: Secondary | ICD-10-CM | POA: Diagnosis not present

## 2017-03-29 DIAGNOSIS — I10 Essential (primary) hypertension: Secondary | ICD-10-CM | POA: Diagnosis present

## 2017-03-29 DIAGNOSIS — E860 Dehydration: Secondary | ICD-10-CM

## 2017-03-29 DIAGNOSIS — I69328 Other speech and language deficits following cerebral infarction: Secondary | ICD-10-CM | POA: Diagnosis not present

## 2017-03-29 DIAGNOSIS — G40909 Epilepsy, unspecified, not intractable, without status epilepticus: Secondary | ICD-10-CM | POA: Diagnosis present

## 2017-03-29 DIAGNOSIS — F0390 Unspecified dementia without behavioral disturbance: Secondary | ICD-10-CM | POA: Diagnosis not present

## 2017-03-29 DIAGNOSIS — Z7982 Long term (current) use of aspirin: Secondary | ICD-10-CM | POA: Diagnosis not present

## 2017-03-29 DIAGNOSIS — I69351 Hemiplegia and hemiparesis following cerebral infarction affecting right dominant side: Secondary | ICD-10-CM | POA: Diagnosis not present

## 2017-03-29 DIAGNOSIS — Z833 Family history of diabetes mellitus: Secondary | ICD-10-CM | POA: Diagnosis not present

## 2017-03-29 DIAGNOSIS — Z79899 Other long term (current) drug therapy: Secondary | ICD-10-CM | POA: Diagnosis not present

## 2017-03-29 DIAGNOSIS — R0989 Other specified symptoms and signs involving the circulatory and respiratory systems: Secondary | ICD-10-CM | POA: Diagnosis not present

## 2017-03-29 DIAGNOSIS — E872 Acidosis: Secondary | ICD-10-CM | POA: Diagnosis not present

## 2017-03-29 DIAGNOSIS — R569 Unspecified convulsions: Secondary | ICD-10-CM | POA: Diagnosis not present

## 2017-03-29 DIAGNOSIS — K859 Acute pancreatitis without necrosis or infection, unspecified: Secondary | ICD-10-CM | POA: Diagnosis not present

## 2017-03-29 DIAGNOSIS — E559 Vitamin D deficiency, unspecified: Secondary | ICD-10-CM | POA: Diagnosis not present

## 2017-03-29 DIAGNOSIS — Z8572 Personal history of non-Hodgkin lymphomas: Secondary | ICD-10-CM | POA: Diagnosis not present

## 2017-03-29 DIAGNOSIS — Z7984 Long term (current) use of oral hypoglycemic drugs: Secondary | ICD-10-CM

## 2017-03-29 DIAGNOSIS — Z66 Do not resuscitate: Secondary | ICD-10-CM | POA: Diagnosis present

## 2017-03-29 DIAGNOSIS — F329 Major depressive disorder, single episode, unspecified: Secondary | ICD-10-CM | POA: Diagnosis not present

## 2017-03-29 LAB — PROTIME-INR
INR: 1.05
PROTHROMBIN TIME: 13.6 s (ref 11.4–15.2)

## 2017-03-29 LAB — GLUCOSE, CAPILLARY
Glucose-Capillary: 202 mg/dL — ABNORMAL HIGH (ref 65–99)
Glucose-Capillary: 115 mg/dL — ABNORMAL HIGH (ref 65–99)

## 2017-03-29 LAB — APTT: APTT: 33 s (ref 24–36)

## 2017-03-29 LAB — COMPREHENSIVE METABOLIC PANEL
ALT: 12 U/L — ABNORMAL LOW (ref 14–54)
ANION GAP: 17 — AB (ref 5–15)
AST: 19 U/L (ref 15–41)
Albumin: 3.8 g/dL (ref 3.5–5.0)
Alkaline Phosphatase: 73 U/L (ref 38–126)
BUN: 16 mg/dL (ref 6–20)
CHLORIDE: 99 mmol/L — AB (ref 101–111)
CO2: 21 mmol/L — AB (ref 22–32)
Calcium: 9.2 mg/dL (ref 8.9–10.3)
Creatinine, Ser: 0.55 mg/dL (ref 0.44–1.00)
GFR calc non Af Amer: >60 mL/min (ref >60)
Glucose, Bld: 196 mg/dL — ABNORMAL HIGH (ref 65–99)
POTASSIUM: 3.2 mmol/L — AB (ref 3.5–5.1)
SODIUM: 137 mmol/L (ref 135–145)
Total Bilirubin: 0.9 mg/dL (ref 0.3–1.2)
Total Protein: 7 g/dL (ref 6.5–8.1)

## 2017-03-29 LAB — CBC
HCT: 42.8 % (ref 35.0–47.0)
HEMOGLOBIN: 14 g/dL (ref 12.0–16.0)
MCH: 29.5 pg (ref 26.0–34.0)
MCHC: 32.8 g/dL (ref 32.0–36.0)
MCV: 89.9 fL (ref 80.0–100.0)
Platelets: 297 10*3/uL (ref 150–440)
RBC: 4.76 MIL/uL (ref 3.80–5.20)
RDW: 14.4 % (ref 11.5–14.5)
WBC: 14.7 10*3/uL — ABNORMAL HIGH (ref 3.6–11.0)

## 2017-03-29 LAB — URINALYSIS, COMPLETE (UACMP) WITH MICROSCOPIC
Bilirubin Urine: NEGATIVE
GLUCOSE, UA: NEGATIVE mg/dL
KETONES UR: 20 mg/dL — AB
LEUKOCYTES UA: NEGATIVE
NITRITE: NEGATIVE
Protein, ur: 100 mg/dL — AB
Specific Gravity, Urine: 1.015 (ref 1.005–1.030)
pH: 6 (ref 5.0–8.0)

## 2017-03-29 LAB — LACTIC ACID, PLASMA
Lactic Acid, Venous: 2.3 mmol/L (ref 0.5–1.9)
Lactic Acid, Venous: 2.2 mmol/L (ref 0.5–1.9)

## 2017-03-29 LAB — PROCALCITONIN: Procalcitonin: <0.1 ng/mL

## 2017-03-29 LAB — TROPONIN I

## 2017-03-29 LAB — MAGNESIUM: MAGNESIUM: 1.4 mg/dL — AB (ref 1.7–2.4)

## 2017-03-29 MED ORDER — VITAMIN B-12 1000 MCG PO TABS
1000.0000 ug | ORAL_TABLET | Freq: Every day | ORAL | Status: DC
  Administered 2017-03-30 – 2017-03-31 (×2): 1000 ug via ORAL
  Filled 2017-03-29 (×2): qty 1

## 2017-03-29 MED ORDER — CEFTRIAXONE SODIUM IN DEXTROSE 20 MG/ML IV SOLN
1.0000 g | Freq: Once | INTRAVENOUS | Status: AC
  Administered 2017-03-29: 1 g via INTRAVENOUS
  Filled 2017-03-29: qty 50

## 2017-03-29 MED ORDER — ALBUTEROL SULFATE (2.5 MG/3ML) 0.083% IN NEBU: 2.5000 mg | INHALATION_SOLUTION | RESPIRATORY_TRACT | Status: DC | PRN

## 2017-03-29 MED ORDER — ONDANSETRON HCL 4 MG PO TABS: 4.0000 mg | ORAL_TABLET | Freq: Four times a day (QID) | ORAL | Status: DC | PRN

## 2017-03-29 MED ORDER — ACETAMINOPHEN 650 MG RE SUPP: 650.0000 mg | Freq: Four times a day (QID) | RECTAL | Status: DC | PRN

## 2017-03-29 MED ORDER — SODIUM CHLORIDE 0.9 % IV BOLUS (SEPSIS)
500.0000 mL | Freq: Once | INTRAVENOUS | Status: AC
  Administered 2017-03-29: 500 mL via INTRAVENOUS

## 2017-03-29 MED ORDER — DEXTROSE 5 % IV SOLN: 2.0000 g | INTRAVENOUS | Status: DC

## 2017-03-29 MED ORDER — POTASSIUM CHLORIDE IN NACL 20-0.9 MEQ/L-% IV SOLN
INTRAVENOUS | Status: DC
  Administered 2017-03-29 – 2017-03-31 (×3): via INTRAVENOUS
  Filled 2017-03-29 (×5): qty 1000

## 2017-03-29 MED ORDER — SENNOSIDES-DOCUSATE SODIUM 8.6-50 MG PO TABS: 1.0000 | ORAL_TABLET | Freq: Every evening | ORAL | Status: DC | PRN

## 2017-03-29 MED ORDER — OXYCODONE HCL 5 MG PO TABS: 5.0000 mg | ORAL_TABLET | ORAL | Status: DC | PRN

## 2017-03-29 MED ORDER — DONEPEZIL HCL 5 MG PO TABS
5.0000 mg | ORAL_TABLET | Freq: Every day | ORAL | Status: DC
  Administered 2017-03-30 – 2017-03-31 (×2): 5 mg via ORAL
  Filled 2017-03-29 (×2): qty 1

## 2017-03-29 MED ORDER — BISACODYL 5 MG PO TBEC: 5.0000 mg | DELAYED_RELEASE_TABLET | Freq: Every day | ORAL | Status: DC | PRN

## 2017-03-29 MED ORDER — ONDANSETRON HCL 4 MG/2ML IJ SOLN: 4.0000 mg | Freq: Four times a day (QID) | INTRAMUSCULAR | Status: DC | PRN

## 2017-03-29 MED ORDER — ACETAMINOPHEN 325 MG PO TABS: 650.0000 mg | ORAL_TABLET | Freq: Four times a day (QID) | ORAL | Status: DC | PRN

## 2017-03-29 MED ORDER — ATORVASTATIN CALCIUM 20 MG PO TABS
40.0000 mg | ORAL_TABLET | Freq: Every day | ORAL | Status: DC
  Administered 2017-03-31: 40 mg via ORAL
  Filled 2017-03-29: qty 2

## 2017-03-29 MED ORDER — LAMOTRIGINE 100 MG PO TABS
150.0000 mg | ORAL_TABLET | Freq: Every day | ORAL | Status: DC
  Administered 2017-03-30 – 2017-03-31 (×2): 150 mg via ORAL
  Filled 2017-03-29 (×2): qty 1

## 2017-03-29 MED ORDER — INSULIN ASPART 100 UNIT/ML ~~LOC~~ SOLN: 0.0000 [IU] | Freq: Every day | SUBCUTANEOUS | Status: DC

## 2017-03-29 MED ORDER — INSULIN ASPART 100 UNIT/ML ~~LOC~~ SOLN
0.0000 [IU] | Freq: Three times a day (TID) | SUBCUTANEOUS | Status: DC
  Administered 2017-03-30: 2 [IU] via SUBCUTANEOUS
  Administered 2017-03-30: 18:00:00 1 [IU] via SUBCUTANEOUS
  Administered 2017-03-31 (×2): 2 [IU] via SUBCUTANEOUS
  Administered 2017-03-31: 1 [IU] via SUBCUTANEOUS
  Filled 2017-03-29 (×5): qty 1

## 2017-03-29 MED ORDER — ASPIRIN EC 81 MG PO TBEC
81.0000 mg | DELAYED_RELEASE_TABLET | Freq: Every day | ORAL | Status: DC
  Administered 2017-03-30 – 2017-03-31 (×2): 81 mg via ORAL
  Filled 2017-03-29 (×2): qty 1

## 2017-03-29 MED ORDER — ENOXAPARIN SODIUM 40 MG/0.4ML ~~LOC~~ SOLN
40.0000 mg | SUBCUTANEOUS | Status: DC
  Administered 2017-03-29 – 2017-03-30 (×2): 40 mg via SUBCUTANEOUS
  Filled 2017-03-29 (×2): qty 0.4

## 2017-03-29 MED ORDER — SERTRALINE HCL 50 MG PO TABS
50.0000 mg | ORAL_TABLET | Freq: Every day | ORAL | Status: DC
  Administered 2017-03-30 – 2017-03-31 (×2): 50 mg via ORAL
  Filled 2017-03-29 (×2): qty 1

## 2017-03-29 NOTE — ED Notes (Signed)
Family at bedside. 

## 2017-03-29 NOTE — ED Notes (Signed)
Attempt to call report unsuccessful.

## 2017-03-29 NOTE — ED Triage Notes (Signed)
Patient presents to the ED with altered mental status that has been intermittent this morning.  Patient's daughter reports that patient has a seizure last night and has history of seizures.  Daughter reports patient has had some cough/congestion in the past few days.  Patient appears pale/grey in triage and is diaphoretic at this time.  Patient will open eyes to touch, unable to answer questions.  Patient has dementia at baseline but daughter states behavior is acutely different.

## 2017-03-29 NOTE — ED Notes (Signed)
Pt family given glycerin swabs to help with sputum

## 2017-03-29 NOTE — ED Notes (Signed)
Patient's daughter reports patient had seizure-like activity while eating last night. States return to baseline before bedtime. Daughter states patient woke up this morning and was unable to hold her head up and was incontinent of stool which is abnormal from baseline. Patient is non-verbal at baseline but has been less responsive today. Daughter states symptoms resolved temporarily around 11 then around 1230 started having symptoms again.

## 2017-03-29 NOTE — ED Notes (Signed)
Dr. Marisa SeverinSiadecki notified in person of critical lactic acid of 2.2. No orders given.

## 2017-03-29 NOTE — ED Notes (Signed)
Pt switched to room closer to nurses station for pt safety. Family notified.

## 2017-03-29 NOTE — Progress Notes (Signed)
Pharmacy Antibiotic Note  Rose CrutchBeulah Jones is a 83 y.o. female admitted on 03/29/2017 with UTI.  Pharmacy has been consulted for ceftriaxone dosing.  Plan: Ceftriaxone 2 grams q 24 hours ordered.  Height: 4\' 11"  (149.9 cm) Weight: 110 lb (49.9 kg) IBW/kg (Calculated) : 43.2  Temp (24hrs), Avg:98.5 F (36.9 C), Min:98.5 F (36.9 C), Max:98.5 F (36.9 C)   Recent Labs Lab 03/29/17 1506 03/29/17 1700  WBC 14.7*  --   CREATININE 0.55  --   LATICACIDVEN 2.3* 2.2*    Estimated Creatinine Clearance: 32.5 mL/min (by C-G formula based on SCr of 0.55 mg/dL).    Allergies  Allergen Reactions  . Estrogens Other (See Comments)    Unknown     Antimicrobials this admission: Ceftriaxone 10/31  >>    >>   Dose adjustments this admission:   Microbiology results: 10/31 BCx: pending 10/31 UCx: pending       10/31 UA: LE (-) NO2(-)  WBC 6-30  Thank you for allowing pharmacy to be a part of this patient's care.  Rose Jones S 03/29/2017 11:17 PM

## 2017-03-29 NOTE — H&P (Signed)
[employer] [facility] [place] [facility]   PATIENT NAME: Rose Jones    MR#:  [id number]  DATE OF BIRTH:  07-15-1925  DATE OF ADMISSION:  03/29/2017  PRIMARY CARE PHYSICIAN: Barbette ReichmannHande, Vishwanath, MD   REQUESTING/REFERRING PHYSICIAN: Dionne BucySiadecki, Sebastian, MD  CHIEF COMPLAINT:   Chief Complaint  Patient presents with  . Altered Mental Status   Altered mental status today. HISTORY OF PRESENT ILLNESS:  Rose Jones  is a 83 y.o. female with a known history of dementia, hypertension, diabetes, seizure disorder and B-cell lymphoma. The patient was sent to the ED due to above chief complaint. The patient is a very demented and noncommunicative. According to her daughter, the patient was found confused and generalized weakness than her baseline today. The patient also had a 1 episode of a spasm in her right arm followed by vomiting last night. In the ED, the patient was found UTI, tachypnea, elevated lactic acid and leukocytosis. She is given Rocephin in ED.  PAST MEDICAL HISTORY:   Past Medical History:  Diagnosis Date  . Dementia   . Diabetes mellitus without complication (HCC)   . History of B-cell lymphoma   . Hypertension   . Seizures (HCC)     PAST SURGICAL HISTORY:   Past Surgical History:  Procedure Laterality Date  . NECK SURGERY      SOCIAL HISTORY:   Social History  Substance Use Topics  . Smoking status: Never Smoker  . Smokeless tobacco: Never Used  . Alcohol use No    FAMILY HISTORY:   Family History  Problem Relation Age of Onset  . Diabetes Father        Died at 5062  . Diabetes Sister     DRUG ALLERGIES:   Allergies  Allergen Reactions  . Estrogens Other (See Comments)    Unknown     REVIEW OF SYSTEMS:   Review of Systems  Unable to perform ROS: Mental status change    MEDICATIONS AT HOME:   Prior to Admission medications   Medication Sig Start Date End Date Taking? Authorizing Provider  amLODipine (NORVASC) 5 MG  tablet Take 1 tablet by mouth daily. 03/16/16  Yes [provider]  aspirin EC 81 MG EC tablet Take 1 tablet (81 mg total) by mouth daily. 05/18/16  Yes Gouru, Deanna ArtisAruna, MD  atorvastatin (LIPITOR) 40 MG tablet Take 1 tablet (40 mg total) by mouth daily at 6 PM. 05/17/16  Yes Gouru, Aruna, MD  donepezil (ARICEPT) 5 MG tablet Take 5 mg by mouth daily. 05/06/16  Yes [provider]  glipiZIDE (GLUCOTROL) 10 MG tablet Take 10 mg by mouth 2 (two) times daily before a meal.  05/06/16  Yes [provider]  lamoTRIgine (LAMICTAL) 150 MG tablet Take 1 tablet by mouth daily. 03/10/17 09/06/17 Yes [provider]  metFORMIN (GLUCOPHAGE) 500 MG tablet Take 1 tablet by mouth 2 (two) times daily. 03/16/16  Yes [provider]  senna-docusate (SENOKOT-S) 8.6-50 MG tablet Take 1 tablet by mouth at bedtime as needed for mild constipation or moderate constipation. 05/17/16  Yes Gouru, Deanna ArtisAruna, MD  sertraline (ZOLOFT) 50 MG tablet Take 1 tablet by mouth daily. 03/16/16  Yes [provider]  vitamin B-12 (CYANOCOBALAMIN) 1000 MCG tablet Take 1 tablet by mouth daily.   Yes [provider]  Vitamin D, Ergocalciferol, (DRISDOL) 50000 units CAPS capsule Take 1 capsule by mouth once a week. 04/15/15  Yes [provider]  acetaminophen (TYLENOL) 325 MG tablet Take  2 tablets (650 mg total) by mouth every 6 (six) hours as needed for mild pain (or temp > 37.5 C (99.5 F)). 05/17/16   Ramonita Lab, MD      VITAL SIGNS:  Blood pressure 129/61, pulse 86, temperature 98.5 F (36.9 C), temperature source Oral, resp. rate 16, height 4\' 11"  (1.499 m), weight 110 lb (49.9 kg), SpO2 93 %.  PHYSICAL EXAMINATION:  Physical Exam  GENERAL:  83 y.o.-year-old patient lying in the bed with no acute distress.  EYES: Pupils equal, round, reactive to light and accommodation. No scleral icterus. Extraocular muscles intact.  HEENT: Head atraumatic, normocephalic. NECK:  Supple,  no jugular venous distention. No thyroid enlargement, no tenderness.  LUNGS: Normal breath sounds bilaterally, no wheezing, rales,rhonchi or crepitation. No use of accessory muscles of respiration.  CARDIOVASCULAR: S1, S2 normal. No murmurs, rubs, or gallops.  ABDOMEN: Soft, nontender, nondistended. Bowel sounds present. No organomegaly or mass.  EXTREMITIES: No pedal edema, cyanosis, or clubbing.  NEUROLOGIC: Unable to exam. PSYCHIATRIC: The patient is demented and noncommunicative.  SKIN: No obvious rash, lesion, or ulcer.   LABORATORY PANEL:   CBC  Recent Labs Lab 03/29/17 1506  WBC 14.7*  HGB 14.0  HCT 42.8  PLT 297   ------------------------------------------------------------------------------------------------------------------  Chemistries   Recent Labs Lab 03/29/17 1506  NA 137  K 3.2*  CL 99*  CO2 21*  GLUCOSE 196*  BUN 16  CREATININE 0.55  CALCIUM 9.2  AST 19  ALT 12*  ALKPHOS 73  BILITOT 0.9   ------------------------------------------------------------------------------------------------------------------  Cardiac Enzymes  Recent Labs Lab 03/29/17 1506  TROPONINI <0.03   ------------------------------------------------------------------------------------------------------------------  RADIOLOGY:  Dg Chest 2 View  Result Date: 03/29/2017 CLINICAL DATA:  Congestion and altered mental status. EXAM: CHEST  2 VIEW COMPARISON:  Chest x-ray dated June 22, 2016. FINDINGS: The cardiomediastinal silhouette is normal in size. Normal pulmonary vascularity. No focal consolidation, pleural effusion, or pneumothorax. No acute osseous abnormality. IMPRESSION: No active cardiopulmonary disease. Electronically Signed   By: Obie Dredge M.D.   On: 03/29/2017 16:21   Ct Head Wo Contrast  Result Date: 03/29/2017 CLINICAL DATA:  Altered mental status.  Recent seizure EXAM: CT HEAD WITHOUT CONTRAST TECHNIQUE: Contiguous axial images were obtained from the base  of the skull through the vertex without intravenous contrast. COMPARISON:  November 24, 2016 FINDINGS: Brain: There is moderate diffuse atrophy, stable. There is no intracranial mass, hemorrhage, extra-axial fluid collection, or midline shift. There is evidence of a prior infarct in the left lentiform nucleus extending to the inferior left centrum semiovale, stable. There is extensive small vessel disease throughout the centra semiovale bilaterally. There is no new gray-white compartment lesion. No acute infarct evident. Vascular: There is no hyperdense vessel. There is calcification in each carotid siphon region. There is slight calcification in each distal vertebral artery. Skull: Bony calvarium appears intact. Sinuses/Orbits: There is opacification in portions of the left sphenoid sinus. There is mucosal thickening in several ethmoid air cells bilaterally. Paranasal sinuses which are visualized elsewhere appear clear. Visualized orbits appear symmetric bilaterally. Other: Visualized mastoid air cells are clear. There is debris in each external auditory canal. IMPRESSION: 1. Stable atrophy with supratentorial small vessel disease. 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 No acute infarct evident. No intracranial mass, hemorrhage, or extra-axial fluid collection. 2.  Foci of arterial vascular calcification noted. 3.  Areas of paranasal sinus disease. 4.  Probable cerumen in each external auditory canal. Electronically Signed  By: Bretta Bang III M.D.   On: 03/29/2017 15:44      IMPRESSION AND PLAN:   Sepsis due to UTI The patient will be admitted to medical floor. Continue Rocephin, follow-up blood culture and urine culture. Lactic acidosis. Continue treatment as above, follow-up lactic acid level. Hypokalemia. Give potassium supplement, follow-up potassium and magnesium level. Diabetes. Start a sliding scale, hold metformin and glipizide. Hypertension.  Blood pressure is normal, hold Norvasc for now. May resume if blood pressure is high. Dementia. Aspiration and fall precaution.     All the records are reviewed and case discussed with ED provider. Management plans discussed with the patient's daughter and the son and they are in agreement.  CODE STATUS: DO NOT RESUSCITATE  TOTAL TIME TAKING CARE OF THIS PATIENT: 55 minutes.    Shaune Pollack M.D on 03/29/2017 at 6:44 PM  Between 7am to 6pm - Pager - [phone]  After 6pm go to [email or web address] - Social research officer, government  [facility] [place] [facility]  Office  [phone]  CC: Primary care physician; Barbette Reichmann, MD   Note: This dictation was prepared with Dragon dictation along with smaller phrase technology. Any transcriptional errors that result from this process are unin

## 2017-03-29 NOTE — ED Provider Notes (Signed)
[facility] Emergency Department Provider Note ____________________________________________   First MD Initiated Contact with Patient 03/29/17 1547     (approximate)  I have reviewed the triage vital signs and the nursing notes.   HISTORY  Chief Complaint Altered Mental Status    HPI Rose Jones is a 83 y.o. female with past medical history of dementia and other medical problems as noted below who presents with altered mental status since this morning, described as increase in generalized weakness, initially improved but then recurring.  Patient's family members also state that she had an episode of spasms in her right arm followed by vomiting last night which they attributed as a possible "small seizure."  They report patient's urine is dark but deny any other acute symptoms.   Past Medical History:  Diagnosis Date  . Dementia   . Diabetes mellitus without complication (HCC)   . History of B-cell lymphoma   . Hypertension   . Seizures [facility])     Patient Active Problem List   Diagnosis Date Noted  . Seizure (HCC) 06/22/2016  . HTN (hypertension) 06/22/2016  . Diabetes (HCC) 06/22/2016  . Dementia 06/22/2016  . CVA (cerebral vascular accident) (HCC) 05/15/2016    Past Surgical History:  Procedure Laterality Date  . NECK SURGERY      Prior to Admission medications   Medication Sig Start Date End Date Taking? Authorizing Provider  amLODipine (NORVASC) 5 MG tablet Take 1 tablet by mouth daily. 03/16/16  Yes [provider]  aspirin EC 81 MG EC tablet Take 1 tablet (81 mg total) by mouth daily. 05/18/16  Yes Gouru, Deanna Artis, MD  atorvastatin (LIPITOR) 40 MG tablet Take 1 tablet (40 mg total) by mouth daily at 6 PM. 05/17/16  Yes Gouru, Aruna, MD  donepezil (ARICEPT) 5 MG tablet Take 5 mg by mouth daily. 05/06/16  Yes [provider]  glipiZIDE (GLUCOTROL) 10 MG tablet Take 10 mg by mouth 2 (two) times daily before a meal.  05/06/16   Yes [provider]  lamoTRIgine (LAMICTAL) 150 MG tablet Take 1 tablet by mouth daily. 03/10/17 09/06/17 Yes [provider]  metFORMIN (GLUCOPHAGE) 500 MG tablet Take 1 tablet by mouth 2 (two) times daily. 03/16/16  Yes [provider]  senna-docusate (SENOKOT-S) 8.6-50 MG tablet Take 1 tablet by mouth at bedtime as needed for mild constipation or moderate constipation. 05/17/16  Yes Gouru, Deanna Artis, MD  sertraline (ZOLOFT) 50 MG tablet Take 1 tablet by mouth daily. 03/16/16  Yes [provider]  vitamin B-12 (CYANOCOBALAMIN) 1000 MCG tablet Take 1 tablet by mouth daily.   Yes [provider]  Vitamin D, Ergocalciferol, (DRISDOL) 50000 units CAPS capsule Take 1 capsule by mouth once a week. 04/15/15  Yes [provider]  acetaminophen (TYLENOL) 325 MG tablet Take 2 tablets (650 mg total) by mouth every 6 (six) hours as needed for mild pain (or temp > 37.5 C (99.5 F)). 05/17/16   Ramonita Lab, MD    Allergies Estrogens  Family History  Problem Relation Age of Onset  . Diabetes Father        Died at 46  . Diabetes Sister     Social History Social History  Substance Use Topics  . Smoking status: Never Smoker  . Smokeless tobacco: Never Used  . Alcohol use No    Review of Systems Level V caveat: unable to obtain ROS due to dementia   ____________________________________________   PHYSICAL EXAM:  VITAL SIGNS: ED  Triage Vitals [03/29/17 1430]  Enc Vitals Group     BP 92/66     Pulse Rate 79     Resp 16     Temp 98.5 F (36.9 C)     Temp Source Oral     SpO2 94 %     Weight 110 lb (49.9 kg)     Height 4\' 11"  (1.499 m)     Head Circumference      Peak Flow      Pain Score      Pain Loc      Pain Edu?      Excl. in GC?     Constitutional: Alert, relatively comfortable appearing.  Eyes: Conjunctivae are normal. EOMI. PERRLA.  Head: Atraumatic. Nose: No congestion/rhinnorhea. Mouth/Throat: Mucous membranes are dry.    Neck: Normal range of motion.  Cardiovascular: Normal rate, regular rhythm. Grossly normal heart sounds.  Good peripheral circulation. Respiratory: Normal respiratory effort.  No retractions. Lungs CTAB. Gastrointestinal: Soft and nontender. No distention.  Genitourinary: No flank tenderness. Musculoskeletal: No lower extremity edema.  Extremities warm and well perfused.  Neurologic:  Motor intact in all extremities.  No gross focal neurologic deficits are appreciated.  Skin:  Skin is warm and dry. No rash noted. Psychiatric: Unable to assess.   ____________________________________________   LABS (all labs ordered are listed, but only abnormal results are displayed)  Labs Reviewed  COMPREHENSIVE METABOLIC PANEL - Abnormal; Notable for the following:       Result Value   Potassium 3.2 (*)    Chloride 99 (*)    CO2 21 (*)    Glucose, Bld 196 (*)    ALT 12 (*)    Anion gap 17 (*)    All other components within normal limits  CBC - Abnormal; Notable for the following:    WBC 14.7 (*)    All other components within normal limits  LACTIC ACID, PLASMA - Abnormal; Notable for the following:    Lactic Acid, Venous 2.3 (*)    All other components within normal limits  LACTIC ACID, PLASMA - Abnormal; Notable for the following:    Lactic Acid, Venous 2.2 (*)    All other components within normal limits  URINALYSIS, COMPLETE (UACMP) WITH MICROSCOPIC - Abnormal; Notable for the following:    Color, Urine AMBER (*)    APPearance CLOUDY (*)    Hgb urine dipstick SMALL (*)    Ketones, ur 20 (*)    Protein, ur 100 (*)    Bacteria, UA RARE (*)    Squamous Epithelial / LPF 0-5 (*)    All other components within normal limits  GLUCOSE, CAPILLARY - Abnormal; Notable for the following:    Glucose-Capillary 202 (*)    All other components within normal limits  CULTURE, BLOOD (ROUTINE X 2)  CULTURE, BLOOD (ROUTINE X 2)  TROPONIN I  CBG MONITORING, ED    ____________________________________________  EKG   ____________________________________________  RADIOLOGY  CT head: no ICH or other acute findings CXR: no focal infiltrate or other acute findings  ____________________________________________   PROCEDURES  Procedure(s) performed: No    Critical Care performed: No ____________________________________________   INITIAL IMPRESSION / ASSESSMENT AND PLAN / ED COURSE  Pertinent labs & imaging results that were available during my care of the patient were reviewed by me and considered in my medical decision making (see chart for details).  83 year old female with past medical history as noted above presents with apparent altered mental status since  this morning with increased generalized weakness.  Past medical records reviewed in epic and are noncontributory to the present visit.  On exam, vital signs are normal, patient is relatively comfortable appearing, and there are no other focal findings.  Differential includes dehydration, other metabolic cause, UTI or other infection, possible CNS cause such as postictal phase (patient has known seizure d/o) or less likely cardiac.  Plan: gentle IV hydration, basic labs, EKG and troponin, infection workup, CT head and reassess.     ----------------------------------------- 6:17 PM on 03/29/2017 -----------------------------------------  Workup reveals a UTI.  CT head and chest x-ray demonstrate no acute findings.  Although her other lab workup is relatively reassuring, given patient's altered mental status, poor functional status, decreased p.o. intake, and difficulty with potentially giving her oral medications, she is a more appropriate candidate for admission for IV antibiotics.  Signed out to hospitalist.   ____________________________________________   FINAL CLINICAL IMPRESSION(S) / ED DIAGNOSES  Final diagnoses:  Urinary tract infection without hematuria, site unspecified   Altered mental status, unspecified altered mental status type  Dehydration      NEW MEDICATIONS STARTED DURING THIS VISIT:  New Prescriptions   No medications on file     Note:  This document was prepared using Dragon voice recognition software and may include unintentional dictation errors.    Dionne BucySiadecki, Jesiah Yerby, MD 03/29/17 1818

## 2017-03-30 LAB — BLOOD CULTURE ID PANEL (REFLEXED)
Acinetobacter baumannii: NOT DETECTED
CANDIDA GLABRATA: NOT DETECTED
CANDIDA KRUSEI: NOT DETECTED
CANDIDA TROPICALIS: NOT DETECTED
Candida albicans: NOT DETECTED
Candida parapsilosis: NOT DETECTED
ENTEROBACTER CLOACAE COMPLEX: NOT DETECTED
ENTEROCOCCUS SPECIES: NOT DETECTED
ESCHERICHIA COLI: NOT DETECTED
Enterobacteriaceae species: NOT DETECTED
Haemophilus influenzae: NOT DETECTED
KLEBSIELLA OXYTOCA: NOT DETECTED
KLEBSIELLA PNEUMONIAE: NOT DETECTED
Listeria monocytogenes: NOT DETECTED
Methicillin resistance: NOT DETECTED
Neisseria meningitidis: NOT DETECTED
PROTEUS SPECIES: NOT DETECTED
Pseudomonas aeruginosa: NOT DETECTED
SERRATIA MARCESCENS: NOT DETECTED
STAPHYLOCOCCUS AUREUS BCID: NOT DETECTED
STAPHYLOCOCCUS SPECIES: DETECTED — AB
STREPTOCOCCUS AGALACTIAE: NOT DETECTED
Streptococcus pneumoniae: NOT DETECTED
Streptococcus pyogenes: NOT DETECTED
Streptococcus species: NOT DETECTED

## 2017-03-30 LAB — BASIC METABOLIC PANEL
Anion gap: 9 (ref 5–15)
BUN: 15 mg/dL (ref 6–20)
CHLORIDE: 104 mmol/L (ref 101–111)
CO2: 26 mmol/L (ref 22–32)
CREATININE: 0.43 mg/dL — AB (ref 0.44–1.00)
Calcium: 8.9 mg/dL (ref 8.9–10.3)
Glucose, Bld: 93 mg/dL (ref 65–99)
POTASSIUM: 2.9 mmol/L — AB (ref 3.5–5.1)
SODIUM: 139 mmol/L (ref 135–145)

## 2017-03-30 LAB — CBC
HCT: 37.6 % (ref 35.0–47.0)
Hemoglobin: 12.9 g/dL (ref 12.0–16.0)
MCH: 30.5 pg (ref 26.0–34.0)
MCHC: 34.3 g/dL (ref 32.0–36.0)
MCV: 88.9 fL (ref 80.0–100.0)
PLATELETS: 272 10*3/uL (ref 150–440)
RBC: 4.22 MIL/uL (ref 3.80–5.20)
RDW: 14.1 % (ref 11.5–14.5)
WBC: 9.7 10*3/uL (ref 3.6–11.0)

## 2017-03-30 LAB — GLUCOSE, CAPILLARY
GLUCOSE-CAPILLARY: 148 mg/dL — AB (ref 65–99)
GLUCOSE-CAPILLARY: 140 mg/dL — AB (ref 65–99)
Glucose-Capillary: 117 mg/dL — ABNORMAL HIGH (ref 65–99)
Glucose-Capillary: 170 mg/dL — ABNORMAL HIGH (ref 65–99)

## 2017-03-30 LAB — MAGNESIUM: MAGNESIUM: 2.3 mg/dL (ref 1.7–2.4)

## 2017-03-30 LAB — LACTIC ACID, PLASMA: Lactic Acid, Venous: 1 mmol/L (ref 0.5–1.9)

## 2017-03-30 LAB — POTASSIUM: POTASSIUM: 3.9 mmol/L (ref 3.5–5.1)

## 2017-03-30 MED ORDER — DEXTROSE 5 % IV SOLN
1.0000 g | INTRAVENOUS | Status: DC
  Administered 2017-03-30: 1 g via INTRAVENOUS
  Filled 2017-03-30 (×2): qty 10

## 2017-03-30 MED ORDER — POTASSIUM CHLORIDE 10 MEQ/100ML IV SOLN
10.0000 meq | INTRAVENOUS | Status: AC
  Administered 2017-03-30 (×2): 10 meq via INTRAVENOUS
  Filled 2017-03-30 (×2): qty 100

## 2017-03-30 MED ORDER — POTASSIUM CHLORIDE 10 MEQ/100ML IV SOLN
10.0000 meq | INTRAVENOUS | Status: AC
  Administered 2017-03-30 (×2): 10 meq via INTRAVENOUS
  Filled 2017-03-30 (×2): qty 100

## 2017-03-30 MED ORDER — MAGNESIUM SULFATE 4 GM/100ML IV SOLN
4.0000 g | Freq: Once | INTRAVENOUS | Status: AC
  Administered 2017-03-30: 4 g via INTRAVENOUS
  Filled 2017-03-30: qty 100

## 2017-03-30 NOTE — Consult Note (Signed)
MEDICATION RELATED CONSULT NOTE - INITIAL   Pharmacy Consult for electrolytes Indication: hypomagnesium, hypokalemia  Allergies  Allergen Reactions  . Estrogens Other (See Comments)    Unknown     Patient Measurements: Height: 4\' 11"  (149.9 cm) Weight: 110 lb (49.9 kg) IBW/kg (Calculated) : 43.2 Adjusted Body Weight:   Vital Signs: Temp: 98.4 F (36.9 C) (11/01 1320) Temp Source: Oral (11/01 1320) BP: 136/67 (11/01 1320) Pulse Rate: 82 (11/01 1320) Intake/Output from previous day: 10/31 0701 - 11/01 0700 In: 550 [IV Piggyback:550] Out: -  Intake/Output from this shift: Total I/O In: 360 [P.O.:360] Out: -   Labs:  Recent Labs  03/29/17 1506 03/29/17 2213 03/30/17 0423 03/30/17 1504  WBC 14.7*  --  9.7  --   HGB 14.0  --  12.9  --   HCT 42.8  --  37.6  --   PLT 297  --  272  --   APTT  --  33  --   --   CREATININE 0.55  --  0.43*  --   MG  --  1.4*  --  2.3  ALBUMIN 3.8  --   --   --   PROT 7.0  --   --   --   AST 19  --   --   --   ALT 12*  --   --   --   ALKPHOS 73  --   --   --   BILITOT 0.9  --   --   --    Estimated Creatinine Clearance: 32.5 mL/min (A) (by C-G formula based on SCr of 0.43 mg/dL (L)).   Microbiology: Recent Results (from the past 720 hour(s))  Culture, blood (Routine x 2)     Status: None (Preliminary result)   Collection Time: 03/29/17  3:06 PM  Result Value Ref Range Status   Specimen Description BLOOD LEFT ARM  Final   Special Requests   Final    BOTTLES DRAWN AEROBIC AND ANAEROBIC Blood Culture adequate volume   Culture NO GROWTH < 24 HOURS  Final   Report Status PENDING  Incomplete  Culture, blood (Routine x 2)     Status: None (Preliminary result)   Collection Time: 03/29/17  5:00 PM  Result Value Ref Range Status   Specimen Description BLOOD LFOA  Final   Special Requests   Final    BOTTLES DRAWN AEROBIC AND ANAEROBIC Blood Culture adequate volume   Culture  Setup Time   Final    Organism ID to follow GRAM POSITIVE  COCCI ANAEROBIC BOTTLE ONLY CRITICAL RESULT CALLED TO, READ BACK BY AND VERIFIED WITH: HANK ZOMPA 03/30/17 1444 KLW    Culture GRAM POSITIVE COCCI  Final   Report Status PENDING  Incomplete    Medical History: Past Medical History:  Diagnosis Date  . Dementia   . Diabetes mellitus without complication (HCC)   . History of B-cell lymphoma   . Hypertension   . Seizures (HCC)     Medications:  Scheduled:  . aspirin EC  81 mg Oral Daily  . atorvastatin  40 mg Oral q1800  . donepezil  5 mg Oral Daily  . enoxaparin (LOVENOX) injection  40 mg Subcutaneous Q24H  . insulin aspart  0-5 Units Subcutaneous QHS  . insulin aspart  0-9 Units Subcutaneous TID WC  . lamoTRIgine  150 mg Oral Daily  . sertraline  50 mg Oral Daily  . vitamin B-12  1,000 mcg Oral Daily  Assessment: Pt is a 83 year old female with dementia who presents with vomiting, UTI. Found to have a K 2.9 and Mg 1.4 this AM  Goal of Therapy:  Normalization of electrolytes  Plan:  11/1 1504 K 3.9, Mg 2.3. IVF with 20 mEq/L at 75 mL/hr continues. No further supplement warranted at this time. Will recheck electrolytes tomorrow with AM labs.   Sadiel Mota A. Rocaookson, [place].D, BCPS Clinical Pharmacist 03/30/2017,3:36 PM

## 2017-03-30 NOTE — Progress Notes (Signed)
PT Cancellation Note  Patient Details Name: Rose Jones Noteboom MRN: [id number] DOB: 05-17-1926   Cancelled Treatment:    Reason Eval/Treat Not Completed: Medical issues which prohibited therapy.  Pt's potassium 2.9. RN reports the pt will receive potassium supplements sometime today.  Will continue to follow acutely and will re-attempt to see pt again later today, schedule permitting.    Encarnacion ChuAshley Itzamara Casas PT, DPT 03/30/2017, 9:33 AM

## 2017-03-30 NOTE — Progress Notes (Signed)
[facility] [place] [facility]   PATIENT NAME: Rose Jones    MR#:  [id number]  DATE OF BIRTH:  05/25/1926  SUBJECTIVE:   Patient nonverbal laying comfortably in bed.  Daughter at bedside. REVIEW OF SYSTEMS:   Review of Systems  Unable to perform ROS: Dementia   Tolerating Diet:yes   DRUG ALLERGIES:   Allergies  Allergen Reactions  . Estrogens Other (See Comments)    Unknown     VITALS:  Blood pressure 136/67, pulse 82, temperature 98.4 F (36.9 C), temperature source Oral, resp. rate 18, height 4\' 11"  (1.499 m), weight 49.9 kg (110 lb), SpO2 94 %.  PHYSICAL EXAMINATION:   Physical Exam  GENERAL:  83 y.o.-year-old patient lying in the bed with no acute distress.  EYES: Pupils equal, round, reactive to light and accommodation. No scleral icterus. Extraocular muscles intact.  HEENT: Head atraumatic, normocephalic. Oropharynx and nasopharynx clear.  NECK:  Supple, no jugular venous distention. No thyroid enlargement, no tenderness.  LUNGS: Normal breath sounds bilaterally, no wheezing, rales, rhonchi. No use of accessory muscles of respiration.  CARDIOVASCULAR: S1, S2 normal. No murmurs, rubs, or gallops.  ABDOMEN: Soft, nontender, nondistended. Bowel sounds present. No organomegaly or mass.  EXTREMITIES: No cyanosis, clubbing or edema b/l.    NEUROLOGIC: Unable to assess due to dementia PSYCHIATRIC:  patient is alert but nonverbal SKIN: No obvious rash, lesion, or ulcer.   LABORATORY PANEL:  CBC  Recent Labs Lab 03/30/17 0423  WBC 9.7  HGB 12.9  HCT 37.6  PLT 272    Chemistries   Recent Labs Lab 03/29/17 1506  03/30/17 0423 03/30/17 1504  NA 137  --  139  --   K 3.2*  --  2.9* 3.9  CL 99*  --  104  --   CO2 21*  --  26  --   GLUCOSE 196*  --  93  --   BUN 16  --  15  --   CREATININE 0.55  --  0.43*  --   CALCIUM 9.2  --  8.9  --   MG  --   < >  --  2.3  AST 19  --   --   --   ALT 12*  --   --   --   ALKPHOS 73   --   --   --   BILITOT 0.9  --   --   --   < > = values in this interval not displayed. Cardiac Enzymes  Recent Labs Lab 03/29/17 1506  TROPONINI <0.03   RADIOLOGY:  Dg Chest 2 View  Result Date: 03/29/2017 CLINICAL DATA:  Congestion and altered mental status. EXAM: CHEST  2 VIEW COMPARISON:  Chest x-ray dated June 22, 2016. FINDINGS: The cardiomediastinal silhouette is normal in size. Normal pulmonary vascularity. No focal consolidation, pleural effusion, or pneumothorax. No acute osseous abnormality. IMPRESSION: No active cardiopulmonary disease. Electronically Signed   By: Obie DredgeWilliam T Derry M.D.   On: 03/29/2017 16:21   Ct Head Wo Contrast  Result Date: 03/29/2017 CLINICAL DATA:  Altered mental status.  Recent seizure EXAM: CT HEAD WITHOUT CONTRAST TECHNIQUE: Contiguous axial images were obtained from the base of the skull through the vertex without intravenous contrast. COMPARISON:  November 24, 2016 FINDINGS: Brain: There is moderate diffuse atrophy, stable. There is no intracranial mass, hemorrhage, extra-axial fluid collection, or midline shift. There is evidence of a prior infarct in the left lentiform nucleus extending to the  inferior left centrum semiovale, stable. There is extensive small vessel disease throughout the centra semiovale bilaterally. There is no new gray-white compartment lesion. No acute infarct evident. Vascular: There is no hyperdense vessel. There is calcification in each carotid siphon region. There is slight calcification in each distal vertebral artery. Skull: Bony calvarium appears intact. Sinuses/Orbits: There is opacification in portions of the left sphenoid sinus. There is mucosal thickening in several ethmoid air cells bilaterally. Paranasal sinuses which are visualized elsewhere appear clear. Visualized orbits appear symmetric bilaterally. Other: Visualized mastoid air cells are clear. There is debris in each external auditory canal. IMPRESSION: 1. Stable atrophy  with supratentorial small vessel disease. 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 No acute infarct evident. No intracranial mass, hemorrhage, or extra-axial fluid collection. 2.  Foci of arterial vascular calcification noted. 3.  Areas of paranasal sinus disease. 4.  Probable cerumen in each external auditory canal. Electronically Signed   By: Bretta Bang III M.D.   On: 03/29/2017 15:44   ASSESSMENT AND PLAN:   Rose Jones  is a 83 y.o. female with a known history of dementia, hypertension, diabetes, seizure disorder and B-cell lymphoma. The patient was sent to the ED due to above chief complaint. The patient is a very demented and noncommunicative. According to her daughter, the patient was found confused and generalized weakness than her baseline today.   1.  Altered mental status more than baseline secondary to UTI -IV Rocephin -IV fluids -Lactic acid normal -White count normal, no fever  2.  Advanced dementia Patient lives at home with daughter who provides full care- Care management for discharge planning  3.  History of seizures -Continue Lamictal  4.  DVT prophylaxis -Subcu Lovenox Was discussed with patient's daughter in the room  Case discussed with Care Management/Social Worker. Management plans discussed with the patient, family and they are in agreement.  CODE STATUS: dnr per daughter  DVT Prophylaxis: lovenox  TOTAL TIME TAKING CARE OF THIS PATIENT: *30* minutes.  >50% time spent on counselling and coordination of care  POSSIBLE D/C IN 1-2 DAYS, DEPENDING ON CLINICAL CONDITION.  Note: This dictation was prepared with Dragon dictation along with smaller phrase technology. Any transcriptional errors that result from this process are unintentional.  Salina Stanfield M.D on 03/30/2017 at 5:16 PM  Between 7am to 6pm - Pager - [phone]  After 6pm go to [email or web address] - Social research officer, government  [facility]   Office  [phone]  CC: Primary care physician; Barbette Reichmann, MD

## 2017-03-30 NOTE — Consult Note (Signed)
MEDICATION RELATED CONSULT NOTE - INITIAL   Pharmacy Consult for electrolytes Indication: hypomagnesium, hypokalemia  Allergies  Allergen Reactions  . Estrogens Other (See Comments)    Unknown     Patient Measurements: Height: 4\' 11"  (149.9 cm) Weight: 110 lb (49.9 kg) IBW/kg (Calculated) : 43.2 Adjusted Body Weight:   Vital Signs: BP: 129/53 (11/01 0442) Pulse Rate: 70 (11/01 0442) Intake/Output from previous day: 10/31 0701 - 11/01 0700 In: 550 [IV Piggyback:550] Out: -  Intake/Output from this shift: No intake/output data recorded.  Labs:  Recent Labs  03/29/17 1506 03/29/17 2213 03/30/17 0423  WBC 14.7*  --  9.7  HGB 14.0  --  12.9  HCT 42.8  --  37.6  PLT 297  --  272  APTT  --  33  --   CREATININE 0.55  --  0.43*  MG  --  1.4*  --   ALBUMIN 3.8  --   --   PROT 7.0  --   --   AST 19  --   --   ALT 12*  --   --   ALKPHOS 73  --   --   BILITOT 0.9  --   --    Estimated Creatinine Clearance: 32.5 mL/min (A) (by C-G formula based on SCr of 0.43 mg/dL (L)).   Microbiology: Recent Results (from the past 720 hour(s))  Culture, blood (Routine x 2)     Status: None (Preliminary result)   Collection Time: 03/29/17  3:06 PM  Result Value Ref Range Status   Specimen Description BLOOD LEFT ARM  Final   Special Requests   Final    BOTTLES DRAWN AEROBIC AND ANAEROBIC Blood Culture adequate volume   Culture NO GROWTH < 24 HOURS  Final   Report Status PENDING  Incomplete  Culture, blood (Routine x 2)     Status: None (Preliminary result)   Collection Time: 03/29/17  5:00 PM  Result Value Ref Range Status   Specimen Description BLOOD LFOA  Final   Special Requests   Final    BOTTLES DRAWN AEROBIC AND ANAEROBIC Blood Culture adequate volume   Culture NO GROWTH < 24 HOURS  Final   Report Status PENDING  Incomplete    Medical History: Past Medical History:  Diagnosis Date  . Dementia   . Diabetes mellitus without complication (HCC)   . History of B-cell  lymphoma   . Hypertension   . Seizures (HCC)     Medications:  Scheduled:  . aspirin EC  81 mg Oral Daily  . atorvastatin  40 mg Oral q1800  . donepezil  5 mg Oral Daily  . enoxaparin (LOVENOX) injection  40 mg Subcutaneous Q24H  . insulin aspart  0-5 Units Subcutaneous QHS  . insulin aspart  0-9 Units Subcutaneous TID WC  . lamoTRIgine  150 mg Oral Daily  . sertraline  50 mg Oral Daily  . vitamin B-12  1,000 mcg Oral Daily    Assessment: Pt is a 83 year old female with dementia who presents with vomiting, UTI. Found to have a K 2.9 and Mg 1.4 this AM  Goal of Therapy:  Normalization of electrolytes  Plan:  Mg IV 4g once KCL 10 MEQ IV x 4 Continue maintenance fluids of NS w/ 20 MEQ @ 7375ml/hr (1.5MEQ/hr) Recheck K and Mg at 1500  Shaniquia Brafford D Daryll Spisak, Pharm.D, BCPS Clinical Pharmacist  03/30/2017,7:44 AM

## 2017-03-30 NOTE — Consult Note (Signed)
Pharmacy Antibiotic Note  Rose CrutchBeulah Jones is a 83 y.o. female admitted on 03/29/2017 with sepsis and UTI.  Pharmacy has been consulted for ceftriaxone dosing.  Plan: Pt PCT <0.1, doubtful for systemic infection. Possible UTI. Will decrease ceftriaxone dose to 1g q 24hr. Follow Ucx. Pharmacy to sign off  Height: 4\' 11"  (149.9 cm) Weight: 110 lb (49.9 kg) IBW/kg (Calculated) : 43.2  Temp (24hrs), Avg:98.5 F (36.9 C), Min:98.5 F (36.9 C), Max:98.5 F (36.9 C)   Recent Labs Lab 03/29/17 1506 03/29/17 1700 03/30/17 0423  WBC 14.7*  --  9.7  CREATININE 0.55  --  0.43*  LATICACIDVEN 2.3* 2.2* 1.0    Estimated Creatinine Clearance: 32.5 mL/min (A) (by C-G formula based on SCr of 0.43 mg/dL (L)).    Allergies  Allergen Reactions  . Estrogens Other (See Comments)    Unknown     Antimicrobials this admission: ceftriaxone 10/31 >>    Dose adjustments this admission:   Microbiology results: 10/31 BCx:  10/31 UCx:     Thank you for allowing pharmacy to be a part of this patient's care.  Rose FlossMelissa D Maryln Jones, Pharm.D, BCPS Clinical Pharmacist  03/30/2017 7:41 AM

## 2017-03-31 LAB — BLOOD CULTURE ID PANEL (REFLEXED)
ACINETOBACTER BAUMANNII: NOT DETECTED
CANDIDA ALBICANS: NOT DETECTED
CANDIDA TROPICALIS: NOT DETECTED
Candida glabrata: NOT DETECTED
Candida krusei: NOT DETECTED
Candida parapsilosis: NOT DETECTED
ENTEROBACTERIACEAE SPECIES: NOT DETECTED
ENTEROCOCCUS SPECIES: NOT DETECTED
Enterobacter cloacae complex: NOT DETECTED
Escherichia coli: NOT DETECTED
HAEMOPHILUS INFLUENZAE: NOT DETECTED
KLEBSIELLA PNEUMONIAE: NOT DETECTED
Klebsiella oxytoca: NOT DETECTED
LISTERIA MONOCYTOGENES: NOT DETECTED
NEISSERIA MENINGITIDIS: NOT DETECTED
Proteus species: NOT DETECTED
Pseudomonas aeruginosa: NOT DETECTED
STREPTOCOCCUS AGALACTIAE: NOT DETECTED
STREPTOCOCCUS SPECIES: NOT DETECTED
Serratia marcescens: NOT DETECTED
Staphylococcus aureus (BCID): NOT DETECTED
Staphylococcus species: NOT DETECTED
Streptococcus pneumoniae: NOT DETECTED
Streptococcus pyogenes: NOT DETECTED

## 2017-03-31 LAB — GLUCOSE, CAPILLARY
GLUCOSE-CAPILLARY: 137 mg/dL — AB (ref 65–99)
Glucose-Capillary: 157 mg/dL — ABNORMAL HIGH (ref 65–99)
Glucose-Capillary: 171 mg/dL — ABNORMAL HIGH (ref 65–99)

## 2017-03-31 LAB — URINE CULTURE

## 2017-03-31 LAB — BASIC METABOLIC PANEL
ANION GAP: 6 (ref 5–15)
BUN: 8 mg/dL (ref 6–20)
CO2: 27 mmol/L (ref 22–32)
Calcium: 8.5 mg/dL — ABNORMAL LOW (ref 8.9–10.3)
Chloride: 104 mmol/L (ref 101–111)
Creatinine, Ser: 0.38 mg/dL — ABNORMAL LOW (ref 0.44–1.00)
GFR calc non Af Amer: >60 mL/min (ref >60)
Glucose, Bld: 152 mg/dL — ABNORMAL HIGH (ref 65–99)
POTASSIUM: 3.6 mmol/L (ref 3.5–5.1)
Sodium: 137 mmol/L (ref 135–145)

## 2017-03-31 LAB — PHOSPHORUS: PHOSPHORUS: 2.2 mg/dL — AB (ref 2.5–4.6)

## 2017-03-31 LAB — TSH: TSH: 2.867 u[IU]/mL (ref 0.350–4.500)

## 2017-03-31 LAB — MAGNESIUM: Magnesium: 1.5 mg/dL — ABNORMAL LOW (ref 1.7–2.4)

## 2017-03-31 MED ORDER — CEPHALEXIN 500 MG PO CAPS: 500.0000 mg | ORAL_CAPSULE | Freq: Two times a day (BID) | ORAL | 0 refills | Status: AC

## 2017-03-31 MED ORDER — CEPHALEXIN 500 MG PO CAPS: 500.0000 mg | ORAL_CAPSULE | Freq: Two times a day (BID) | ORAL | 0 refills | Status: DC

## 2017-03-31 MED ORDER — K PHOS MONO-SOD PHOS DI & MONO 155-852-130 MG PO TABS
500.0000 mg | ORAL_TABLET | ORAL | Status: DC
  Administered 2017-03-31: 500 mg via ORAL
  Filled 2017-03-31 (×2): qty 2

## 2017-03-31 MED ORDER — MAGNESIUM SULFATE 4 GM/100ML IV SOLN
4.0000 g | Freq: Once | INTRAVENOUS | Status: AC
  Administered 2017-03-31: 09:00:00 4 g via INTRAVENOUS
  Filled 2017-03-31: qty 100

## 2017-03-31 MED ORDER — MULTI-VITAMIN/MINERALS PO TABS: 1.0000 | ORAL_TABLET | Freq: Every day | ORAL | 2 refills | Status: AC

## 2017-03-31 MED ORDER — POTASSIUM PHOSPHATES 15 MMOLE/5ML IV SOLN
20.0000 mmol | Freq: Once | INTRAVENOUS | Status: AC
  Administered 2017-03-31: 20 mmol via INTRAVENOUS
  Filled 2017-03-31: qty 6.67

## 2017-03-31 MED ORDER — CEPHALEXIN 500 MG PO CAPS
500.0000 mg | ORAL_CAPSULE | Freq: Two times a day (BID) | ORAL | Status: DC
  Administered 2017-03-31: 500 mg via ORAL
  Filled 2017-03-31 (×2): qty 1

## 2017-03-31 MED ORDER — MULTI-VITAMIN/MINERALS PO TABS: 1.0000 | ORAL_TABLET | Freq: Every day | ORAL | 2 refills | Status: DC

## 2017-03-31 NOTE — Care Management Note (Addendum)
Case Management Note  Patient Details  Name: Rose Jones MRN: [id number] Date of Birth: 03/23/1926  Subjective/Objective:    Admitted to [facility] with the diagnosis of sepsis. Lives with daughter, Rose Jones [phone]). last seen Dr. Marcello FennelHande 3 months ago. [facility] [place] or [address].  No home Health in the home. [facility] 05/17/2016 for 20 days. No home oxygen. Rolling walker, bedside commode, shower bench, and wheelchair in the home. Needs help with feeding, dressing, and baths. Family is with Rose Jones 24/7. Last fall was June 27th 2018. Good appetite. Family will transport                Action/Plan: Physical therapy evaluation pending   Expected Discharge Date:                  Expected Discharge Plan:     In-House Referral:   yes  Discharge planning Services     Post Acute Care Choice:   yes Choice offered to:   daughter  DME Arranged:   yes DME Agency:   [facility]   HH Arranged:   yes HH Agency:   [facility]  Status of Service:     If discussed at Long Length of Stay Meetings, dates discussed:    Additional Comments:  Gwenette GreetBrenda S Lamontae Ricardo, RN MSN CCM Care Management [phone] 03/31/2017, 8:42 AM

## 2017-03-31 NOTE — Progress Notes (Signed)
Family ok with taking pt home late this pm per CM.will order hospital bed and hoyer lift also. [facility] reus[facility]

## 2017-03-31 NOTE — Care Management Important Message (Signed)
Important Message  Patient Details  Name: Rose CrutchBeulah Jones MRN: [id number] Date of Birth: 03/07/1926   Medicare Important Message Given:  Yes    Gwenette GreetBrenda S Gerlad Pelzel, RN 03/31/2017, 7:51 AM

## 2017-03-31 NOTE — Discharge Summary (Signed)
[facility] [place] [facility]   PATIENT NAME: Rose Jones    MR#:  [id number]  DATE OF BIRTH:  March 28, 1926  DATE OF ADMISSION:  03/29/2017 ADMITTING PHYSICIAN: Shaune PollackQing Chen, MD  DATE OF DISCHARGE: 03/31/2017  PRIMARY CARE PHYSICIAN: Barbette ReichmannHande, Vishwanath, MD    ADMISSION DIAGNOSIS:  Dehydration [E86.0] Urinary tract infection without hematuria, site unspecified [N39.0] Altered mental status, unspecified altered mental status type [R41.82]  DISCHARGE DIAGNOSIS:  AMS due to UTI  SECONDARY DIAGNOSIS:   Past Medical History:  Diagnosis Date  . Dementia   . Diabetes mellitus without complication (HCC)   . History of B-cell lymphoma   . Hypertension   . Seizures [facility](HCC)     HOSPITAL COURSE:   [name] a 83 y.o.femalewith a known history of dementia, hypertension, diabetes, seizure disorder and B-cell lymphoma. The patient was sent to the ED due to above chief complaint. The patient is a very demented and noncommunicative. According to her daughter, the patient was found confused and generalized weakness than her baseline today.   1.  Altered mental status more than baseline secondary to UTI -IV Rocephin--keflex 500 mg bid -received IV fluids -Lactic acid normal -White count normal, no fever -1/2 BC staph species per lab prelim report  Staph Coag neg  2. Advanced dementia Patient lives at home with daughter who provides full care Care management for discharge planning  3.  History of seizures -Continue Lamictal  4.  DVT prophylaxis -Subcut Lovenox Was discussed with patient's daughter in the room D/c later today after IV mag and phosphorus is given Family agreeable CONSULTS OBTAINED:    DRUG ALLERGIES:   Allergies  Allergen Reactions  . Estrogens Other (See Comments)    Unknown     DISCHARGE MEDICATIONS:   Current Discharge Medication List    START taking these medications   Details  cephALEXin (KEFLEX) 500 MG  capsule Take 1 capsule (500 mg total) by mouth 2 (two) times daily. Qty: 12 capsule, Refills: 0    Multiple Vitamins-Minerals (MULTIVITAMIN WITH MINERALS) tablet Take 1 tablet by mouth daily. Qty: 120 tablet, Refills: 2      CONTINUE these medications which have NOT CHANGED   Details  amLODipine (NORVASC) 5 MG tablet Take 1 tablet by mouth daily.    aspirin EC 81 MG EC tablet Take 1 tablet (81 mg total) by mouth daily.    atorvastatin (LIPITOR) 40 MG tablet Take 1 tablet (40 mg total) by mouth daily at 6 PM. Qty: 30 tablet, Refills: 0    donepezil (ARICEPT) 5 MG tablet Take 5 mg by mouth daily.    glipiZIDE (GLUCOTROL) 10 MG tablet Take 10 mg by mouth 2 (two) times daily before a meal.     lamoTRIgine (LAMICTAL) 150 MG tablet Take 1 tablet by mouth daily.    metFORMIN (GLUCOPHAGE) 500 MG tablet Take 1 tablet by mouth 2 (two) times daily.    senna-docusate (SENOKOT-S) 8.6-50 MG tablet Take 1 tablet by mouth at bedtime as needed for mild constipation or moderate constipation.    sertraline (ZOLOFT) 50 MG tablet Take 1 tablet by mouth daily.    vitamin B-12 (CYANOCOBALAMIN) 1000 MCG tablet Take 1 tablet by mouth daily.    Vitamin D, Ergocalciferol, (DRISDOL) 50000 units CAPS capsule Take 1 capsule by mouth once a week.    acetaminophen (TYLENOL) 325 MG tablet Take 2 tablets (650 mg total) by mouth every 6 (six) hours as needed for mild pain (or temp >  37.5 C (99.5 F)).        If you experience worsening of your admission symptoms, develop shortness of breath, life threatening emergency, suicidal or homicidal thoughts you must seek medical attention immediately by calling 911 or calling your MD immediately  if symptoms less severe.  You Must read complete instructions/literature along with all the possible adverse reactions/side effects for all the Medicines you take and that have been prescribed to you. Take any new Medicines after you have completely understood and accept all the  possible adverse reactions/side effects.   Please note  You were cared for by a hospitalist during your hospital stay. If you have any questions about your discharge medications or the care you received while you were in the hospital after you are discharged, you can call the unit and asked to speak with the hospitalist on call if the hospitalist that took care of you is not available. Once you are discharged, your primary care physician will handle any further medical issues. Please note that NO REFILLS for any discharge medications will be authorized once you are discharged, as it is imperative that you return to your primary care physician (or establish a relationship with a primary care physician if you do not have one) for your aftercare needs so that they can reassess your need for medications and monitor your lab values. Today   SUBJECTIVE  Comfortable Family in the room Pt nonverbal   VITAL SIGNS:  Blood pressure (!) 168/78, pulse 64, temperature 97.6 F (36.4 C), temperature source Oral, resp. rate 15, height 4\' 11"  (1.499 m), weight 49.9 kg (110 lb), SpO2 96 %.  I/O:   Intake/Output Summary (Last 24 hours) at 03/31/17 0948 Last data filed at 03/31/17 0400  Gross per 24 hour  Intake           2087.5 ml  Output                0 ml  Net           2087.5 ml    PHYSICAL EXAMINATION:  Exam limited due to Dementia  GENERAL:  83 y.o.-year-old patient lying in the bed with no acute distress.  NECK:  Supple, no jugular venous distention. No thyroid enlargement, no tenderness.  LUNGS: Normal breath sounds bilaterally, no wheezing, rales,rhonchi or crepitation. No use of accessory muscles of respiration.  CARDIOVASCULAR: S1, S2 normal. No murmurs, rubs, or gallops.  ABDOMEN: Soft, non-tender, non-distended. Bowel sounds present. No organomegaly or mass.  EXTREMITIES: No pedal edema, cyanosis, or clubbing.  NEUROLOGIC: limited due to dementia PSYCHIATRIC: non verbal  SKIN: No  obvious rash, lesion, or ulcer.   DATA REVIEW:   CBC   Recent Labs Lab 03/30/17 0423  WBC 9.7  HGB 12.9  HCT 37.6  PLT 272    Chemistries   Recent Labs Lab 03/29/17 1506  03/31/17 0425  NA 137  < > 137  K 3.2*  < > 3.6  CL 99*  < > 104  CO2 21*  < > 27  GLUCOSE 196*  < > 152*  BUN 16  < > 8  CREATININE 0.55  < > 0.38*  CALCIUM 9.2  < > 8.5*  MG  --   < > 1.5*  AST 19  --   --   ALT 12*  --   --   ALKPHOS 73  --   --   BILITOT 0.9  --   --   < > =  values in this interval not displayed.  Microbiology Results   Recent Results (from the past 240 hour(s))  Culture, blood (Routine x 2)     Status: None (Preliminary result)   Collection Time: 03/29/17  3:06 PM  Result Value Ref Range Status   Specimen Description BLOOD LEFT ARM  Final   Special Requests   Final    BOTTLES DRAWN AEROBIC AND ANAEROBIC Blood Culture adequate volume   Culture NO GROWTH 2 DAYS  Final   Report Status PENDING  Incomplete  Urine culture     Status: Abnormal   Collection Time: 03/29/17  3:55 PM  Result Value Ref Range Status   Specimen Description URINE, RANDOM  Final   Special Requests NONE  Final   Culture MULTIPLE SPECIES PRESENT, SUGGEST RECOLLECTION (A)  Final   Report Status 03/31/2017 FINAL  Final  Culture, blood (Routine x 2)     Status: None (Preliminary result)   Collection Time: 03/29/17  5:00 PM  Result Value Ref Range Status   Specimen Description BLOOD LFOA  Final   Special Requests   Final    BOTTLES DRAWN AEROBIC AND ANAEROBIC Blood Culture adequate volume   Culture  Setup Time   Final    GRAM POSITIVE COCCI ANAEROBIC BOTTLE ONLY CRITICAL RESULT CALLED TO, READ BACK BY AND VERIFIED WITH: HANK ZOMPA 03/30/17 1444 KLW    Culture   Final    GRAM POSITIVE COCCI CULTURE REINCUBATED FOR BETTER GROWTH Performed at [facility], [address] [place], [place] [address]    Report Status PENDING  Incomplete  Blood Culture ID Panel (Reflexed)     Status: Abnormal    Collection Time: 03/29/17  5:00 PM  Result Value Ref Range Status   Enterococcus species NOT DETECTED NOT DETECTED Final   Listeria monocytogenes NOT DETECTED NOT DETECTED Final   Staphylococcus species DETECTED (A) NOT DETECTED Final    Comment: Methicillin (oxacillin) susceptible coagulase negative staphylococcus. Possible blood culture contaminant (unless isolated from more than one blood culture draw or clinical case suggests pathogenicity). No antibiotic treatment is indicated for blood  culture contaminants. CRITICAL RESULT CALLED TO, READ BACK BY AND VERIFIED WITH: HANK ZOMPA AT 1444 ON 03/30/2017 KLW/JJB    Staphylococcus aureus NOT DETECTED NOT DETECTED Final   Methicillin resistance NOT DETECTED NOT DETECTED Final   Streptococcus species NOT DETECTED NOT DETECTED Final   Streptococcus agalactiae NOT DETECTED NOT DETECTED Final   Streptococcus pneumoniae NOT DETECTED NOT DETECTED Final   Streptococcus pyogenes NOT DETECTED NOT DETECTED Final   Acinetobacter baumannii NOT DETECTED NOT DETECTED Final   Enterobacteriaceae species NOT DETECTED NOT DETECTED Final   Enterobacter cloacae complex NOT DETECTED NOT DETECTED Final   Escherichia coli NOT DETECTED NOT DETECTED Final   Klebsiella oxytoca NOT DETECTED NOT DETECTED Final   Klebsiella pneumoniae NOT DETECTED NOT DETECTED Final   Proteus species NOT DETECTED NOT DETECTED Final   Serratia marcescens NOT DETECTED NOT DETECTED Final   Haemophilus influenzae NOT DETECTED NOT DETECTED Final   Neisseria meningitidis NOT DETECTED NOT DETECTED Final   Pseudomonas aeruginosa NOT DETECTED NOT DETECTED Final   Candida albicans NOT DETECTED NOT DETECTED Final   Candida glabrata NOT DETECTED NOT DETECTED Final   Candida krusei NOT DETECTED NOT DETECTED Final   Candida parapsilosis NOT DETECTED NOT DETECTED Final   Candida tropicalis NOT DETECTED NOT DETECTED Final    RADIOLOGY:  Dg Chest 2 View  Result Date: 03/29/2017 CLINICAL DATA:  Congestion and altered mental status. EXAM: CHEST  2 VIEW COMPARISON:  Chest x-ray dated June 22, 2016. FINDINGS: The cardiomediastinal silhouette is normal in size. Normal pulmonary vascularity. No focal consolidation, pleural effusion, or pneumothorax. No acute osseous abnormality. IMPRESSION: No active cardiopulmonary disease. Electronically Signed   By: Obie Dredge M.D.   On: 03/29/2017 16:21   Ct Head Wo Contrast  Result Date: 03/29/2017 CLINICAL DATA:  Altered mental status.  Recent seizure EXAM: CT HEAD WITHOUT CONTRAST TECHNIQUE: Contiguous axial images were obtained from the base of the skull through the vertex without intravenous contrast. COMPARISON:  November 24, 2016 FINDINGS: Brain: There is moderate diffuse atrophy, stable. There is no intracranial mass, hemorrhage, extra-axial fluid collection, or midline shift. There is evidence of a prior infarct in the left lentiform nucleus extending to the inferior left centrum semiovale, stable. There is extensive small vessel disease throughout the centra semiovale bilaterally. There is no new gray-white compartment lesion. No acute infarct evident. Vascular: There is no hyperdense vessel. There is calcification in each carotid siphon region. There is slight calcification in each distal vertebral artery. Skull: Bony calvarium appears intact. Sinuses/Orbits: There is opacification in portions of the left sphenoid sinus. There is mucosal thickening in several ethmoid air cells bilaterally. Paranasal sinuses which are visualized elsewhere appear clear. Visualized orbits appear symmetric bilaterally. Other: Visualized mastoid air cells are clear. There is debris in each external auditory canal. IMPRESSION: 1. Stable atrophy with supratentorial small vessel disease. 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 No acute infarct evident. No intracranial mass, hemorrhage, or extra-axial fluid collection. 2.  Foci of  arterial vascular calcification noted. 3.  Areas of paranasal sinus disease. 4.  Probable cerumen in each external auditory canal. Electronically Signed   By: Bretta Bang III M.D.   On: 03/29/2017 15:44     Management plans discussed with the patient, family and they are in agreement.  CODE STATUS:     Code Status Orders        Start     Ordered   03/29/17 2207  Do not attempt resuscitation (DNR)  Continuous    Question Answer Comment  In the event of cardiac or respiratory ARREST Do not call a "code blue"   In the event of cardiac or respiratory ARREST Do not perform Intubation, CPR, defibrillation or ACLS   In the event of cardiac or respiratory ARREST Use medication by any route, position, wound care, and other measures to relive pain and suffering. May use oxygen, suction and manual treatment of airway obstruction as needed for comfort.      03/29/17 2206    Code Status History    Date Active Date Inactive Code Status Order ID Comments User Context   06/23/2016  1:39 AM 06/23/2016  8:54 PM DNR [id number]  Oralia Manis, MD Inpatient   05/16/2016  5:07 PM 05/17/2016  9:19 PM DNR [id number]  Ramonita Lab, MD Inpatient   05/15/2016 10:11 PM 05/16/2016  5:07 PM Full Code [id number]  Hugelmeyer, Jon Gills, DO Inpatient    Advance Directive Documentation     Most Recent Value  Type of Advance Directive  Healthcare Power of Attorney, Living will  Pre-existing out of facility DNR order (yellow form or pink MOST form)  -  "MOST" Form in Place?  -      TOTAL TIME TAKING CARE OF THIS PATIENT: *40* minutes.    Pattie Flaharty M.D on 03/31/2017 at 9:48 AM  Between 7am  to 6pm - Pager - [phone] After 6pm go to [email or web address] - Social research officer, government  [facility]  Office  [phone]  CC: Primary care physician; Barbette Reichmann, MD

## 2017-03-31 NOTE — Evaluation (Signed)
Physical Therapy Evaluation Patient Details Name: Rose Jones MRN: [id number] DOB: Dec 13, 1925 Today's Date: 03/31/2017   History of Present Illness  Pt is a 83 y/o F who presented with AMS and generalized weakness.  Pt was found to have UTI.  Pt's PMH includes dementia, seizure disorder, CVA, lymphoma, neck surgery.    Clinical Impression  Pt admitted with above diagnosis. Pt currently with functional limitations due to the deficits listed below (see PT Problem List). Ms. Mckeon presents with generalized BUE and BLE weakness and she is very lethargic.  Pt only keeps her eyes open with bed mobility and when sitting EOB.  She does not follow verbal cues or gestures and thus PROM therapeutic exercises were completed.  She requires total assist for bed mobility.  Daughter's wish is for pt to return home at d/c where the daughter has been the primary caregiver.  Recommending hoyer lift, WC, and hospital bed for improved safety at home for the pt and the caregiver.  Additionally recommend continued HHPT at d/c for education on use of lift and transfers with [facility] as well as for therapeutic intervention. Pt will benefit from skilled PT to increase their independence and safety with mobility to allow discharge to the venue listed below.      Follow Up Recommendations Home health PT (Continue HHPT, HHOT, HHST)    Equipment Recommendations  Other (comment);Wheelchair (measurements PT);Wheelchair cushion (measurements PT);Hospital bed [facility] lift)    Recommendations for Other Services       Precautions / Restrictions Precautions Precautions: Fall Restrictions Weight Bearing Restrictions: Yes RUE Weight Bearing: Weight bearing as tolerated Other Position/Activity Restrictions: WBAT in R wrist as per daughter pt with several small fractures in her R wrist.  Daughter reports fracture is not from a fall, unknown MOI.   Pt has been wearing wrist brace for comfort during the day.       Mobility  Bed  Mobility Overal bed mobility: Needs Assistance Bed Mobility: Supine to Sit;Sit to Supine     Supine to sit: Total assist;HOB elevated Sit to supine: Max assist;+2 for physical assistance   General bed mobility comments: Pt requires total assist for supine>sit without evidence of effort from pt.  To return to supine she requires assist with trunk and BLEs.   Transfers                 General transfer comment: Unable to assess  Ambulation/Gait                Stairs            Wheelchair Mobility    Modified Rankin (Stroke Patients Only)       Balance Overall balance assessment: Needs assistance;History of Falls Sitting-balance support: Bilateral upper extremity supported;Feet supported Sitting balance-Leahy Scale: Poor Sitting balance - Comments: Pt relies on BUE support in sitting as well as mod>max assist to prevent LOB to her R Postural control: Right lateral lean                                   Pertinent Vitals/Pain Pain Assessment: Faces Faces Pain Scale: Hurts little more Pain Location: grimacing with RUE mobility (baseline since stroke) Pain Descriptors / Indicators: Grimacing;Guarding Pain Intervention(s): Limited activity within patient's tolerance;Monitored during session    Home Living Family/patient expects to be discharged to:: Private residence Living Arrangements: Children Available Help at Discharge: Family;Available 24 hours/day Type of Home:  House Home Access: Ramped entrance     Home Layout: One level;Able to live on main level with bedroom/bathroom;Laundry or work area in [employer]ement Home Equipment: Tub bench;Transport chair;Bedside commode (Retail buyerLift chair)      Prior Function Level of Independence: Needs assistance   Gait / Transfers Assistance Needed: Pt not ambulating.  Has not ambulated since seizure in June 2018.  She requires total assist for stand pivot transfers to transport chair, BSC, lift chair.  Last fall  was in June.  As transport chair does not fit through bathroom doorway the pt's daughter and another person assist have been lifting and shuffling pt to tub bench from transport chair for a shower 1x/wk.   ADL's / Homemaking Assistance Needed: Daughter assists pt with sponge bath while pt sitting on BSC.  1x/wk she does the sponge bathing in the shower with use of a tub bench.  Pt requires assist for dressing.          Hand Dominance        Extremity/Trunk Assessment   Upper Extremity Assessment Upper Extremity Assessment: Difficult to assess due to impaired cognition    Lower Extremity Assessment Lower Extremity Assessment: Difficult to assess due to impaired cognition       Communication   Communication: Expressive difficulties (nonverbal this session, which is baseline since stroke)  Cognition Arousal/Alertness: Lethargic Behavior During Therapy: Flat affect Overall Cognitive Status: History of cognitive impairments - at baseline                                 General Comments: Pt nonverbal and very lethargic.  She opens her eyes briefly when repositioned and keeps eyes open only during bed mobility and sitting EOB.  She is unable to follow verbal or gestural cues.       General Comments General comments (skin integrity, edema, etc.): Daughter and Daughter in law present during evaluation    Exercises General Exercises - Upper Extremity Shoulder Flexion: PROM;Both;10 reps;Supine General Exercises - Lower Extremity Ankle Circles/Pumps: PROM;Both;10 reps;Supine Long Arc Quad: PROM;Both;5 reps;Seated Heel Slides: PROM;Both;10 reps;Supine Hip ABduction/ADduction: PROM;Left;10 reps;Supine Straight Leg Raises: PROM;Both;10 reps;Supine   Assessment/Plan    PT Assessment Patient needs continued PT services  PT Problem List Decreased strength;Decreased balance;Decreased activity tolerance;Decreased range of motion;Decreased mobility;Decreased  cognition;Decreased coordination;Decreased knowledge of use of DME;Decreased safety awareness;Pain       PT Treatment Interventions DME instruction;Functional mobility training;Therapeutic activities;Therapeutic exercise;Balance training;Neuromuscular re-education;Cognitive remediation;Patient/family education;Wheelchair mobility training    PT Goals (Current goals can be found in the Care Plan section)  Acute Rehab PT Goals Patient Stated Goal: Per daughter, to return home PT Goal Formulation: With family Time For Goal Achievement: 04/14/17 Potential to Achieve Goals: Fair    Frequency Min 2X/week   Barriers to discharge        Co-evaluation               AM-PAC PT "6 Clicks" Daily Activity  Outcome Measure Difficulty turning over in bed (including adjusting bedclothes, sheets and blankets)?: Unable Difficulty moving from lying on back to sitting on the side of the bed? : Unable Difficulty sitting down on and standing up from a chair with arms (e.g., wheelchair, bedside commode, etc,.)?: Unable Help needed moving to and from a bed to chair (including a wheelchair)?: Total Help needed walking in hospital room?: Total Help needed climbing 3-5 steps with a railing? : Total 6 Click  Score: 6    End of Session   Activity Tolerance: Patient limited by lethargy;Patient limited by fatigue Patient left: in bed;with call bell/phone within reach;with bed alarm set;with family/visitor present Nurse Communication: Mobility status PT Visit Diagnosis: Muscle weakness (generalized) (M62.81);History of falling (Z91.81);Unsteadiness on feet (R26.81)    Time: 8119-1478 PT Time Calculation (min) (ACUTE ONLY): 30 min   Charges:   PT Evaluation $PT Eval Moderate Complexity: 1 Mod PT Treatments $Therapeutic Activity: 8-22 mins   PT G Codes:   PT G-Codes **NOT FOR INPATIENT CLASS** Functional Assessment Tool Used: AM-PAC 6 Clicks Basic Mobility;Clinical judgement Functional  Limitation: Changing and maintaining body position Changing and Maintaining Body Position Current Status (G9562): 100 percent impaired, limited or restricted Changing and Maintaining Body Position Goal Status (Z3086): At least 60 percent but less than 80 percent impaired, limited or restricted    Encarnacion Chu PT, DPT 03/31/2017, 11:49 AM

## 2017-03-31 NOTE — Progress Notes (Signed)
PHARMACY - PHYSICIAN COMMUNICATION CRITICAL VALUE ALERT - BLOOD CULTURE IDENTIFICATION (BCID)  Results for orders placed or performed during the hospital encounter of 03/29/17  Blood Culture ID Panel (Reflexed) (Collected: 03/29/2017  3:06 PM)  Result Value Ref Range   Enterococcus species NOT DETECTED NOT DETECTED   Listeria monocytogenes NOT DETECTED NOT DETECTED   Staphylococcus species NOT DETECTED NOT DETECTED   Staphylococcus aureus NOT DETECTED NOT DETECTED   Streptococcus species NOT DETECTED NOT DETECTED   Streptococcus agalactiae NOT DETECTED NOT DETECTED   Streptococcus pneumoniae NOT DETECTED NOT DETECTED   Streptococcus pyogenes NOT DETECTED NOT DETECTED   Acinetobacter baumannii NOT DETECTED NOT DETECTED   Enterobacteriaceae species NOT DETECTED NOT DETECTED   Enterobacter cloacae complex NOT DETECTED NOT DETECTED   Escherichia coli NOT DETECTED NOT DETECTED   Klebsiella oxytoca NOT DETECTED NOT DETECTED   Klebsiella pneumoniae NOT DETECTED NOT DETECTED   Proteus species NOT DETECTED NOT DETECTED   Serratia marcescens NOT DETECTED NOT DETECTED   Haemophilus influenzae NOT DETECTED NOT DETECTED   Neisseria meningitidis NOT DETECTED NOT DETECTED   Pseudomonas aeruginosa NOT DETECTED NOT DETECTED   Candida albicans NOT DETECTED NOT DETECTED   Candida glabrata NOT DETECTED NOT DETECTED   Candida krusei NOT DETECTED NOT DETECTED   Candida parapsilosis NOT DETECTED NOT DETECTED   Candida tropicalis NOT DETECTED NOT DETECTED    Name of physician (or Provider) Contacted: Enedina FinnerSona Patel   Changes to prescribed antibiotics required:  No, continue pt on keflex.   Brienna Bass D 03/31/2017  4:19 PM

## 2017-03-31 NOTE — Consult Note (Addendum)
MEDICATION RELATED CONSULT NOTE   Pharmacy Consult for electrolytes Indication: hypomagnesium, hypokalemia  Allergies  Allergen Reactions  . Estrogens Other (See Comments)    Unknown     Patient Measurements: Height: 4\' 11"  (149.9 cm) Weight: 110 lb (49.9 kg) IBW/kg (Calculated) : 43.2 Adjusted Body Weight:   Vital Signs: Temp: 97.6 F (36.4 C) (11/02 0432) Temp Source: Oral (11/02 0432) BP: 168/78 (11/02 0432) Pulse Rate: 64 (11/02 0432) Intake/Output from previous day: 11/01 0701 - 11/02 0700 In: 2087.5 [P.O.:360; I.V.:1477.5; IV Piggyback:250] Out: -  Intake/Output from this shift: No intake/output data recorded.  Labs:  Recent Labs  03/29/17 1506 03/29/17 2213 03/30/17 0423 03/30/17 1504 03/31/17 0425  WBC 14.7*  --  9.7  --   --   HGB 14.0  --  12.9  --   --   HCT 42.8  --  37.6  --   --   PLT 297  --  272  --   --   APTT  --  33  --   --   --   CREATININE 0.55  --  0.43*  --  0.38*  MG  --  1.4*  --  2.3 1.5*  PHOS  --   --   --   --  2.2*  ALBUMIN 3.8  --   --   --   --   PROT 7.0  --   --   --   --   AST 19  --   --   --   --   ALT 12*  --   --   --   --   ALKPHOS 73  --   --   --   --   BILITOT 0.9  --   --   --   --    Lab Results  Component Value Date   K 3.6 03/31/2017   Estimated Creatinine Clearance: 32.5 mL/min (A) (by C-G formula based on SCr of 0.38 mg/dL (L)).   Medical History: Past Medical History:  Diagnosis Date  . Dementia   . Diabetes mellitus without complication (HCC)   . History of B-cell lymphoma   . Hypertension   . Seizures (HCC)     Medications:  Scheduled:  . aspirin EC  81 mg Oral Daily  . atorvastatin  40 mg Oral q1800  . donepezil  5 mg Oral Daily  . enoxaparin (LOVENOX) injection  40 mg Subcutaneous Q24H  . insulin aspart  0-5 Units Subcutaneous QHS  . insulin aspart  0-9 Units Subcutaneous TID WC  . lamoTRIgine  150 mg Oral Daily  . phosphorus  500 mg Oral Q4H  . sertraline  50 mg Oral Daily  .  vitamin B-12  1,000 mcg Oral Daily    Assessment: Pt is a 83 year old female with dementia who presents with vomiting, UTI. Found to have a K 2.9 and Mg 1.4 this AM Hx seizures.  Goal of Therapy:  Normalization of electrolytes  Plan:  11/1 1504 K 3.9, Mg 2.3. IVF with 20 mEq/L at 75 mL/hr continues. No further supplement warranted at this time. Will recheck electrolytes tomorrow with AM labs.   11/2 am labs: K 3.6, Mag 1.5, Phos 2.2  Patient continues on IVF NS w/ KCL 6020meq/L @ 75 ml/hr. Will order Magnesium sulfate 4 gram IV x 1. Will order KPhos neutral PO 2 tabs Q4h x 4 doses.  Addendum: MD would like to give Potassium Phosphate as IV. Will  change to KPhos 20 mmol IV x 1  Bari Mantis PharmD Clinical Pharmacist 03/31/2017

## 2017-03-31 NOTE — Progress Notes (Signed)
PHARMACY - PHYSICIAN COMMUNICATION CRITICAL VALUE ALERT - BLOOD CULTURE IDENTIFICATION (BCID)  Results for orders placed or performed during the hospital encounter of 03/29/17  Blood Culture ID Panel (Reflexed) (Collected: 03/29/2017  5:00 PM)  Result Value Ref Range   Enterococcus species NOT DETECTED NOT DETECTED   Listeria monocytogenes NOT DETECTED NOT DETECTED   Staphylococcus species DETECTED (A) NOT DETECTED   Staphylococcus aureus NOT DETECTED NOT DETECTED   Methicillin resistance NOT DETECTED NOT DETECTED   Streptococcus species NOT DETECTED NOT DETECTED   Streptococcus agalactiae NOT DETECTED NOT DETECTED   Streptococcus pneumoniae NOT DETECTED NOT DETECTED   Streptococcus pyogenes NOT DETECTED NOT DETECTED   Acinetobacter baumannii NOT DETECTED NOT DETECTED   Enterobacteriaceae species NOT DETECTED NOT DETECTED   Enterobacter cloacae complex NOT DETECTED NOT DETECTED   Escherichia coli NOT DETECTED NOT DETECTED   Klebsiella oxytoca NOT DETECTED NOT DETECTED   Klebsiella pneumoniae NOT DETECTED NOT DETECTED   Proteus species NOT DETECTED NOT DETECTED   Serratia marcescens NOT DETECTED NOT DETECTED   Haemophilus influenzae NOT DETECTED NOT DETECTED   Neisseria meningitidis NOT DETECTED NOT DETECTED   Pseudomonas aeruginosa NOT DETECTED NOT DETECTED   Candida albicans NOT DETECTED NOT DETECTED   Candida glabrata NOT DETECTED NOT DETECTED   Candida krusei NOT DETECTED NOT DETECTED   Candida parapsilosis NOT DETECTED NOT DETECTED   Candida tropicalis NOT DETECTED NOT DETECTED    Name of physician (or Provider) Contacted: Enedina FinnerSona Patel  Changes to prescribed antibiotics required: none. Patient on Ceftriaxone already, likely contaminate  Raeven Pint A 03/31/2017  8:57 AM

## 2017-03-31 NOTE — Progress Notes (Signed)
Pt is being discharged today, discharge instructions reviewed with her daughter who is here legal guardian. She verified understanding. All belongings packed and returned to the patient. IV x1 removed, she will finish her dinner then be rolled out in a wheelchair by staff.

## 2017-03-31 NOTE — Care Management (Addendum)
Daughter requested hospital and hoyer lift. Ms. Rose Jones will need hospital bed because she spends 75-80% of the time in the bed. Problems with aspiration which causes frequent chocking. Upper part of the body needs to be elevated 30 degrees or greater to prevent aspiration. Bed wedges do not provide adequate elevation to resolve the problem of aspiration. Ms. Rose Jones requires frequent body changes in body position which can not be achieve with a normal bed.  [facility] Gwenette GreetBrenda S Kwame Ryland RN MSN CCM Care management [phone]

## 2017-03-31 NOTE — Progress Notes (Addendum)
Pt's dter reports her mother was more alert before she came.  CT head on admission was negative for any vascular event. Pt's with dementia and having UTI can fluctuate with there mentation. Family wants pt to stay here. Hemodynamically stable.  Electrolyte being replaced. Hold d/c for now

## 2017-04-01 DIAGNOSIS — R569 Unspecified convulsions: Secondary | ICD-10-CM | POA: Diagnosis not present

## 2017-04-01 DIAGNOSIS — I639 Cerebral infarction, unspecified: Secondary | ICD-10-CM | POA: Diagnosis not present

## 2017-04-01 LAB — LDL CHOLESTEROL, DIRECT: LDL DIRECT: 52 mg/dL (ref 0–99)

## 2017-04-02 LAB — CULTURE, BLOOD (ROUTINE X 2): SPECIAL REQUESTS: ADEQUATE

## 2017-04-03 ENCOUNTER — Other Ambulatory Visit: Payer: Self-pay | Admitting: Internal Medicine

## 2017-04-03 DIAGNOSIS — K859 Acute pancreatitis without necrosis or infection, unspecified: Secondary | ICD-10-CM | POA: Diagnosis not present

## 2017-04-03 DIAGNOSIS — F329 Major depressive disorder, single episode, unspecified: Secondary | ICD-10-CM | POA: Diagnosis not present

## 2017-04-03 DIAGNOSIS — G40909 Epilepsy, unspecified, not intractable, without status epilepticus: Secondary | ICD-10-CM | POA: Diagnosis not present

## 2017-04-03 DIAGNOSIS — R1310 Dysphagia, unspecified: Secondary | ICD-10-CM

## 2017-04-03 DIAGNOSIS — F0390 Unspecified dementia without behavioral disturbance: Secondary | ICD-10-CM | POA: Diagnosis not present

## 2017-04-03 DIAGNOSIS — I10 Essential (primary) hypertension: Secondary | ICD-10-CM | POA: Diagnosis not present

## 2017-04-03 DIAGNOSIS — E559 Vitamin D deficiency, unspecified: Secondary | ICD-10-CM | POA: Diagnosis not present

## 2017-04-03 DIAGNOSIS — I69351 Hemiplegia and hemiparesis following cerebral infarction affecting right dominant side: Secondary | ICD-10-CM | POA: Diagnosis not present

## 2017-04-03 DIAGNOSIS — I69328 Other speech and language deficits following cerebral infarction: Secondary | ICD-10-CM | POA: Diagnosis not present

## 2017-04-03 DIAGNOSIS — E119 Type 2 diabetes mellitus without complications: Secondary | ICD-10-CM | POA: Diagnosis not present

## 2017-04-04 LAB — CULTURE, BLOOD (ROUTINE X 2): Special Requests: ADEQUATE

## 2017-04-05 ENCOUNTER — Other Ambulatory Visit: Payer: Self-pay

## 2017-04-05 NOTE — Patient Outreach (Signed)
[facility] Care Management  04/05/2017  Rose CrutchBeulah Jones January 20, 1926 [id number]   EMMI: General discharge Referral date: 04/05/17 Referral source: EMMI general discharge red alert Referral reason: Know who to call about changes in condition: NO Day # 1 Attempt #1  Telephone call to patient regarding EMMI general discharge. Unable to reach patient or leave voice message. Phone only rang.   PLAN: RNCM will attempt 2nd telephone outreach to patient within 5 business days.   Rose InaDavina Lexton Hidalgo RN,BSN,CCM [facility] Telephonic  [phone]

## 2017-04-07 ENCOUNTER — Other Ambulatory Visit: Payer: Self-pay

## 2017-04-07 NOTE — Patient Outreach (Signed)
[facility] Care Management  04/07/2017  Shane CrutchBeulah Norment Oct 28, 1925 [id number]  EMMI: General discharge Referral date: 04/05/17 Referral source: EMMI general discharge red alert Referral reason: Know who to call about changes in condition: NO Day # 1 Attempt #2  Telephone call to patient regarding EMMI general discharge. Unable to reach patient or leave voice message. Phone only rang.   PLAN: RNCM will attempt 3rd telephone outreach to patient within 5 business days.   George InaDavina Mickle Campton RN,BSN,CCM [facility] Telephonic  [phone]

## 2017-04-10 ENCOUNTER — Other Ambulatory Visit: Payer: Self-pay

## 2017-04-10 NOTE — Patient Outreach (Signed)
[facility] Care Management  04/10/2017  Rose CrutchBeulah Jones 1925-10-18 [id number]    EMMI: General discharge Referral date: 04/05/17 Referral source: EMMI general discharge red alert Referral reason: Know who to call about changes in condition: NO Day # 1 Attempt #3  Telephone call to patient regarding EMMI general discharge. Unable to reach patient or leave voice message. Phone only rang.  PLAN:RNCM will send patient outreach letter to attempt contact.  Rose InaDavina Raelan Burgoon RN,BSN,CCM [facility] Telephonic  [phone]

## 2017-04-12 ENCOUNTER — Ambulatory Visit
Admission: RE | Admit: 2017-04-12 | Discharge: 2017-04-12 | Disposition: A | Discharge status: Home / Self Care | Payer: Medicare HMO | Source: Ambulatory Visit | Attending: Internal Medicine | Admitting: Internal Medicine

## 2017-04-12 DIAGNOSIS — R7989 Other specified abnormal findings of blood chemistry: Secondary | ICD-10-CM | POA: Diagnosis not present

## 2017-04-12 DIAGNOSIS — R1310 Dysphagia, unspecified: Secondary | ICD-10-CM | POA: Insufficient documentation

## 2017-04-12 DIAGNOSIS — F0150 Vascular dementia without behavioral disturbance: Secondary | ICD-10-CM | POA: Diagnosis not present

## 2017-04-12 DIAGNOSIS — T17308A Unspecified foreign body in larynx causing other injury, initial encounter: Secondary | ICD-10-CM | POA: Diagnosis not present

## 2017-04-12 DIAGNOSIS — F0280 Dementia in other diseases classified elsewhere without behavioral disturbance: Secondary | ICD-10-CM | POA: Diagnosis not present

## 2017-04-12 DIAGNOSIS — E119 Type 2 diabetes mellitus without complications: Secondary | ICD-10-CM | POA: Diagnosis not present

## 2017-04-12 DIAGNOSIS — G309 Alzheimer's disease, unspecified: Secondary | ICD-10-CM | POA: Diagnosis not present

## 2017-04-12 DIAGNOSIS — I10 Essential (primary) hypertension: Secondary | ICD-10-CM | POA: Diagnosis not present

## 2017-04-12 DIAGNOSIS — R1312 Dysphagia, oropharyngeal phase: Secondary | ICD-10-CM

## 2017-04-12 DIAGNOSIS — Z0000 Encounter for general adult medical examination without abnormal findings: Secondary | ICD-10-CM | POA: Diagnosis not present

## 2017-04-12 DIAGNOSIS — Z23 Encounter for immunization: Secondary | ICD-10-CM | POA: Diagnosis not present

## 2017-04-12 DIAGNOSIS — I63312 Cerebral infarction due to thrombosis of left middle cerebral artery: Secondary | ICD-10-CM | POA: Diagnosis not present

## 2017-04-12 NOTE — Therapy (Signed)
  Subjective: Patient behavior: Pt arrived in radiology for outpatient MBS. Pt alert, but nonvocal, does not follow commands. No family present. Chief complaint: None   Objective:  Assessment of swallow function and safety, as well as identification of least restrictive diet.  Radiological Procedure: A videoflouroscopic evaluation of oral-preparatory, reflex initiation, and pharyngeal phases of the swallow was performed; as well as a screening of the upper esophageal phase.  I. POSTURE: Seated in wheelchair  II. VIEW: Lateral  III. COMPENSATORY STRATEGIES: Pt unable to utilize compensatory strategies, due to inability to follow verbal commands.   IV. BOLUSES ADMINISTERED:  Thin Liquid: via straw    Puree: via half teaspoon and full teaspoon   Mechanical Soft: graham cracker with puree  V. RESULTS OF EVALUATION: A. ORAL PREPARATORY PHASE: (The lips, tongue, and velum are observed for strength and coordination)       Pt exhibited slight anterior leakage of thin and puree, as well as decreased bolus formation and posterior propulsion. Extended time was required for mastication  of cracker, which may increase fatigue and/or aspiration risk across a meal. Insignificant oral residue noted after the swallow.   B. SWALLOW INITIATION/REFLEX: (The reflex is normal if "triggered" by the time the bolus reached the base of the tongue)  Pt exhibited delayed swallow reflex across consistencies, with trigger noted at the vallecula or pyriform on thin liquids, and at the vallecula on puree and solid  consistencies.   C. PHARYNGEAL PHASE: (Pharyngeal function is normal if the bolus shows rapid, smooth, and continuous transit through the pharynx and there is no pharyngeal residue after the swallow)  Minimal vallecular and pyriform residue was noted after the swallow of puree. Otherwise, no post-swallow residue was seen.   D. LARYNGEAL PENETRATION: (Material entering into the laryngeal inlet/vestibule but  not aspirated)    Intermittent trace flash penetration of thin liquids was noted, however, penetrate cleared completely and was not aspirated. No penetration of puree or cracker   Consistencies. Given pt age, penetration falls within normal limits.  E. ASPIRATION: None seen on any consistency  F. ESOPHAGEAL PHASE: (Screening of the upper esophagus) WFL  ASSESSMENT: Pt presents with mild oropharyngeal dysphagia, characterized by poor bolus formation and extended oral prep of advanced consistencies, delayed swallow reflex, and intermittent trace flash penetration of thin. Pt inability to follow verbal commands increases risk of difficulty, due to inability to follow safe swallow precautions (swallow again, clear throat, etc). Extended oral prep increases risk for fatigue, malnutrition, and aspiration due to adverse effects of poor endurance on safety.  More conservative solids will be beneficial to maximize adequate po intake and decrease fatigue and aspiration risk. Recommend assistance with self feeding to further facilitate safety and adequate intake.  PLAN/RECOMMENDATIONS:  A. Diet: Puree and finely chopped solids, thin liquids via straw (use cup only if pt is able to drink from the cup herself with limited assistance), crush meds.  B. Swallowing Precautions: minimize distractions, small bites/sips, slow rate, upright position, feed only when fully alert  C. Recommended consultation to dietician for appropriate recommendations for supplements  D. Therapy recommendations: defer to primary SLP  E. Results and recommendations were included in report and sent to referring MD.  Merlene Laughterelia B. Murvin NatalBueche, [facility] HospitalMSP, CCC-SLP Speech Language Pathologist [phone]

## 2017-04-17 DIAGNOSIS — K859 Acute pancreatitis without necrosis or infection, unspecified: Secondary | ICD-10-CM | POA: Diagnosis not present

## 2017-04-17 DIAGNOSIS — F0390 Unspecified dementia without behavioral disturbance: Secondary | ICD-10-CM | POA: Diagnosis not present

## 2017-04-17 DIAGNOSIS — I69351 Hemiplegia and hemiparesis following cerebral infarction affecting right dominant side: Secondary | ICD-10-CM | POA: Diagnosis not present

## 2017-04-17 DIAGNOSIS — E119 Type 2 diabetes mellitus without complications: Secondary | ICD-10-CM | POA: Diagnosis not present

## 2017-04-17 DIAGNOSIS — I69328 Other speech and language deficits following cerebral infarction: Secondary | ICD-10-CM | POA: Diagnosis not present

## 2017-04-17 DIAGNOSIS — E559 Vitamin D deficiency, unspecified: Secondary | ICD-10-CM | POA: Diagnosis not present

## 2017-04-17 DIAGNOSIS — F329 Major depressive disorder, single episode, unspecified: Secondary | ICD-10-CM | POA: Diagnosis not present

## 2017-04-17 DIAGNOSIS — G40909 Epilepsy, unspecified, not intractable, without status epilepticus: Secondary | ICD-10-CM | POA: Diagnosis not present

## 2017-04-17 DIAGNOSIS — I10 Essential (primary) hypertension: Secondary | ICD-10-CM | POA: Diagnosis not present

## 2017-04-17 NOTE — Telephone Encounter (Signed)
This encounter was created in error - please disregard.

## 2017-04-18 DIAGNOSIS — K859 Acute pancreatitis without necrosis or infection, unspecified: Secondary | ICD-10-CM | POA: Diagnosis not present

## 2017-04-18 DIAGNOSIS — F0390 Unspecified dementia without behavioral disturbance: Secondary | ICD-10-CM | POA: Diagnosis not present

## 2017-04-18 DIAGNOSIS — F329 Major depressive disorder, single episode, unspecified: Secondary | ICD-10-CM | POA: Diagnosis not present

## 2017-04-18 DIAGNOSIS — I69328 Other speech and language deficits following cerebral infarction: Secondary | ICD-10-CM | POA: Diagnosis not present

## 2017-04-18 DIAGNOSIS — I10 Essential (primary) hypertension: Secondary | ICD-10-CM | POA: Diagnosis not present

## 2017-04-18 DIAGNOSIS — E559 Vitamin D deficiency, unspecified: Secondary | ICD-10-CM | POA: Diagnosis not present

## 2017-04-18 DIAGNOSIS — I69351 Hemiplegia and hemiparesis following cerebral infarction affecting right dominant side: Secondary | ICD-10-CM | POA: Diagnosis not present

## 2017-04-18 DIAGNOSIS — E119 Type 2 diabetes mellitus without complications: Secondary | ICD-10-CM | POA: Diagnosis not present

## 2017-04-18 DIAGNOSIS — G40909 Epilepsy, unspecified, not intractable, without status epilepticus: Secondary | ICD-10-CM | POA: Diagnosis not present

## 2017-04-19 DIAGNOSIS — I10 Essential (primary) hypertension: Secondary | ICD-10-CM | POA: Diagnosis not present

## 2017-04-19 DIAGNOSIS — F0390 Unspecified dementia without behavioral disturbance: Secondary | ICD-10-CM | POA: Diagnosis not present

## 2017-04-19 DIAGNOSIS — E559 Vitamin D deficiency, unspecified: Secondary | ICD-10-CM | POA: Diagnosis not present

## 2017-04-19 DIAGNOSIS — I69328 Other speech and language deficits following cerebral infarction: Secondary | ICD-10-CM | POA: Diagnosis not present

## 2017-04-19 DIAGNOSIS — I69351 Hemiplegia and hemiparesis following cerebral infarction affecting right dominant side: Secondary | ICD-10-CM | POA: Diagnosis not present

## 2017-04-19 DIAGNOSIS — F329 Major depressive disorder, single episode, unspecified: Secondary | ICD-10-CM | POA: Diagnosis not present

## 2017-04-19 DIAGNOSIS — K859 Acute pancreatitis without necrosis or infection, unspecified: Secondary | ICD-10-CM | POA: Diagnosis not present

## 2017-04-19 DIAGNOSIS — G40909 Epilepsy, unspecified, not intractable, without status epilepticus: Secondary | ICD-10-CM | POA: Diagnosis not present

## 2017-04-19 DIAGNOSIS — E119 Type 2 diabetes mellitus without complications: Secondary | ICD-10-CM | POA: Diagnosis not present

## 2017-04-24 ENCOUNTER — Other Ambulatory Visit: Payer: Self-pay

## 2017-04-24 DIAGNOSIS — G40909 Epilepsy, unspecified, not intractable, without status epilepticus: Secondary | ICD-10-CM | POA: Diagnosis not present

## 2017-04-24 DIAGNOSIS — E119 Type 2 diabetes mellitus without complications: Secondary | ICD-10-CM | POA: Diagnosis not present

## 2017-04-24 DIAGNOSIS — I69328 Other speech and language deficits following cerebral infarction: Secondary | ICD-10-CM | POA: Diagnosis not present

## 2017-04-24 DIAGNOSIS — F329 Major depressive disorder, single episode, unspecified: Secondary | ICD-10-CM | POA: Diagnosis not present

## 2017-04-24 DIAGNOSIS — F0390 Unspecified dementia without behavioral disturbance: Secondary | ICD-10-CM | POA: Diagnosis not present

## 2017-04-24 DIAGNOSIS — I69351 Hemiplegia and hemiparesis following cerebral infarction affecting right dominant side: Secondary | ICD-10-CM | POA: Diagnosis not present

## 2017-04-24 DIAGNOSIS — I10 Essential (primary) hypertension: Secondary | ICD-10-CM | POA: Diagnosis not present

## 2017-04-24 DIAGNOSIS — K859 Acute pancreatitis without necrosis or infection, unspecified: Secondary | ICD-10-CM | POA: Diagnosis not present

## 2017-04-24 DIAGNOSIS — E559 Vitamin D deficiency, unspecified: Secondary | ICD-10-CM | POA: Diagnosis not present

## 2017-04-24 NOTE — Patient Outreach (Signed)
[facility] Care Management  04/24/2017  Rose Jones [birth year] [id number]   No response from patient after 3 telephone calls and outreach letter attempt.   PLAN:  RNCM will refer patient to care management assistant to close due to being unable to contact patient.  RNCM will send patient "know before you go sheet."   George InaDavina Jasline Buskirk RN,BSN,CCM [facility] Telephonic  [phone]

## 2017-04-26 DIAGNOSIS — F329 Major depressive disorder, single episode, unspecified: Secondary | ICD-10-CM | POA: Diagnosis not present

## 2017-04-26 DIAGNOSIS — G40909 Epilepsy, unspecified, not intractable, without status epilepticus: Secondary | ICD-10-CM | POA: Diagnosis not present

## 2017-04-26 DIAGNOSIS — I69328 Other speech and language deficits following cerebral infarction: Secondary | ICD-10-CM | POA: Diagnosis not present

## 2017-04-26 DIAGNOSIS — I10 Essential (primary) hypertension: Secondary | ICD-10-CM | POA: Diagnosis not present

## 2017-04-26 DIAGNOSIS — E119 Type 2 diabetes mellitus without complications: Secondary | ICD-10-CM | POA: Diagnosis not present

## 2017-04-26 DIAGNOSIS — F0390 Unspecified dementia without behavioral disturbance: Secondary | ICD-10-CM | POA: Diagnosis not present

## 2017-04-26 DIAGNOSIS — E559 Vitamin D deficiency, unspecified: Secondary | ICD-10-CM | POA: Diagnosis not present

## 2017-04-26 DIAGNOSIS — K859 Acute pancreatitis without necrosis or infection, unspecified: Secondary | ICD-10-CM | POA: Diagnosis not present

## 2017-04-26 DIAGNOSIS — I69351 Hemiplegia and hemiparesis following cerebral infarction affecting right dominant side: Secondary | ICD-10-CM | POA: Diagnosis not present

## 2017-04-27 DIAGNOSIS — E119 Type 2 diabetes mellitus without complications: Secondary | ICD-10-CM | POA: Diagnosis not present

## 2017-04-27 DIAGNOSIS — K859 Acute pancreatitis without necrosis or infection, unspecified: Secondary | ICD-10-CM | POA: Diagnosis not present

## 2017-04-27 DIAGNOSIS — G40909 Epilepsy, unspecified, not intractable, without status epilepticus: Secondary | ICD-10-CM | POA: Diagnosis not present

## 2017-04-27 DIAGNOSIS — I69351 Hemiplegia and hemiparesis following cerebral infarction affecting right dominant side: Secondary | ICD-10-CM | POA: Diagnosis not present

## 2017-04-27 DIAGNOSIS — F329 Major depressive disorder, single episode, unspecified: Secondary | ICD-10-CM | POA: Diagnosis not present

## 2017-04-27 DIAGNOSIS — I69328 Other speech and language deficits following cerebral infarction: Secondary | ICD-10-CM | POA: Diagnosis not present

## 2017-04-27 DIAGNOSIS — I10 Essential (primary) hypertension: Secondary | ICD-10-CM | POA: Diagnosis not present

## 2017-04-27 DIAGNOSIS — F0390 Unspecified dementia without behavioral disturbance: Secondary | ICD-10-CM | POA: Diagnosis not present

## 2017-04-27 DIAGNOSIS — E559 Vitamin D deficiency, unspecified: Secondary | ICD-10-CM | POA: Diagnosis not present

## 2017-06-21 DIAGNOSIS — G308 Other Alzheimer's disease: Secondary | ICD-10-CM | POA: Diagnosis not present

## 2017-06-21 DIAGNOSIS — F0280 Dementia in other diseases classified elsewhere without behavioral disturbance: Secondary | ICD-10-CM | POA: Diagnosis not present

## 2017-06-21 DIAGNOSIS — F0150 Vascular dementia without behavioral disturbance: Secondary | ICD-10-CM | POA: Diagnosis not present

## 2017-06-21 DIAGNOSIS — L89322 Pressure ulcer of left buttock, stage 2: Secondary | ICD-10-CM | POA: Diagnosis not present

## 2017-08-01 DIAGNOSIS — F0280 Dementia in other diseases classified elsewhere without behavioral disturbance: Secondary | ICD-10-CM | POA: Diagnosis not present

## 2017-08-01 DIAGNOSIS — G308 Other Alzheimer's disease: Secondary | ICD-10-CM | POA: Diagnosis not present

## 2017-08-01 DIAGNOSIS — R1310 Dysphagia, unspecified: Secondary | ICD-10-CM | POA: Diagnosis not present

## 2017-08-01 DIAGNOSIS — F0150 Vascular dementia without behavioral disturbance: Secondary | ICD-10-CM | POA: Diagnosis not present

## 2017-10-19 DIAGNOSIS — I69318 Other symptoms and signs involving cognitive functions following cerebral infarction: Secondary | ICD-10-CM | POA: Diagnosis not present

## 2017-10-19 DIAGNOSIS — F0150 Vascular dementia without behavioral disturbance: Secondary | ICD-10-CM | POA: Diagnosis not present

## 2017-10-19 DIAGNOSIS — R634 Abnormal weight loss: Secondary | ICD-10-CM | POA: Diagnosis not present

## 2017-10-19 DIAGNOSIS — K861 Other chronic pancreatitis: Secondary | ICD-10-CM | POA: Diagnosis not present

## 2017-10-19 DIAGNOSIS — G40409 Other generalized epilepsy and epileptic syndromes, not intractable, without status epilepticus: Secondary | ICD-10-CM | POA: Diagnosis not present

## 2017-10-19 DIAGNOSIS — M24542 Contracture, left hand: Secondary | ICD-10-CM | POA: Diagnosis not present

## 2017-10-19 DIAGNOSIS — E119 Type 2 diabetes mellitus without complications: Secondary | ICD-10-CM | POA: Diagnosis not present

## 2017-10-19 DIAGNOSIS — I69398 Other sequelae of cerebral infarction: Secondary | ICD-10-CM | POA: Diagnosis not present

## 2017-12-18 DIAGNOSIS — F0150 Vascular dementia without behavioral disturbance: Secondary | ICD-10-CM | POA: Diagnosis not present

## 2017-12-18 DIAGNOSIS — M24542 Contracture, left hand: Secondary | ICD-10-CM | POA: Diagnosis not present

## 2017-12-18 DIAGNOSIS — G40409 Other generalized epilepsy and epileptic syndromes, not intractable, without status epilepticus: Secondary | ICD-10-CM | POA: Diagnosis not present

## 2017-12-18 DIAGNOSIS — E119 Type 2 diabetes mellitus without complications: Secondary | ICD-10-CM | POA: Diagnosis not present

## 2017-12-18 DIAGNOSIS — R634 Abnormal weight loss: Secondary | ICD-10-CM | POA: Diagnosis not present

## 2017-12-18 DIAGNOSIS — I69318 Other symptoms and signs involving cognitive functions following cerebral infarction: Secondary | ICD-10-CM | POA: Diagnosis not present

## 2017-12-18 DIAGNOSIS — I69398 Other sequelae of cerebral infarction: Secondary | ICD-10-CM | POA: Diagnosis not present

## 2017-12-18 DIAGNOSIS — I10 Essential (primary) hypertension: Secondary | ICD-10-CM | POA: Diagnosis not present

## 2017-12-18 DIAGNOSIS — K861 Other chronic pancreatitis: Secondary | ICD-10-CM | POA: Diagnosis not present

## 2018-01-28 DEATH — deceased

## 2018-09-29 IMAGING — MR MR MRA HEAD W/O CM
10 of 13 series · 33 of 48 positions shown · non-contrast
Comparison: Head CT without contrast 05/15/2016. Brain MRI
03/10/2006.

ADDENDUM:
Study discussed by telephone with Dr. Kennico on 05/16/2016 At 3666
hours.
CLINICAL DATA: 82-year-old female with abrupt onset mental status
changes 2 days prior to presentation. Initial encounter.

EXAM:
MRI HEAD WITHOUT CONTRAST
MRA HEAD WITHOUT CONTRAST
TECHNIQUE: Multiplanar, multiecho pulse sequences of the brain and surrounding
structures were obtained without intravenous contrast. Angiographic
images of the head were obtained using MRA technique without
contrast.

[Series 2: T1 · sagittal · 5.0mm · 0.45mm/px · 2 of 27 slices shown (1 of 2)]
[im 1/27]
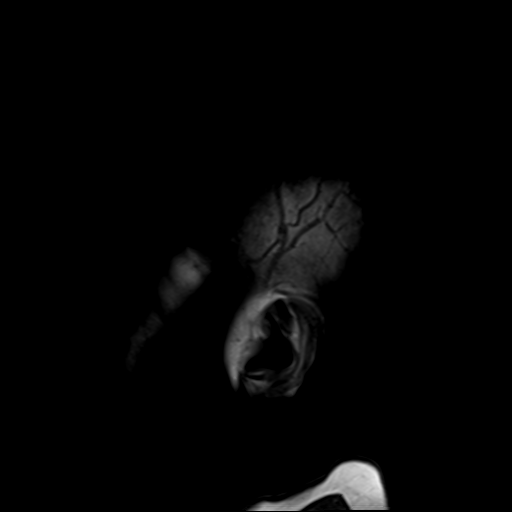
[im 27/27]
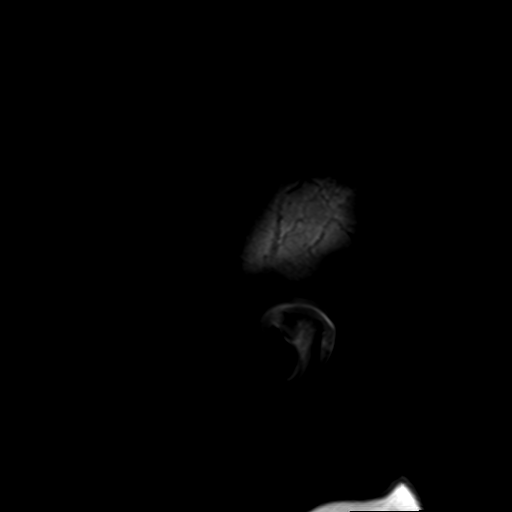

[Series 4: DWI · axial · 3.0mm · 1.80mm/px · z∈[-52,+103]mm · 5 of 55 slices shown (1 of 2)]
[im 1/55]
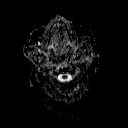
[im 14/55]
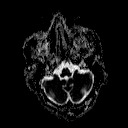
[im 28/55]
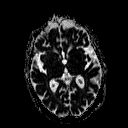
[im 41/55]
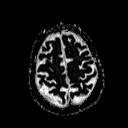
[im 55/55]
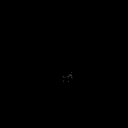

[Series 6: DWI · coronal · 3.0mm · 1.80mm/px · 4 of 45 slices shown (2 of 2)]
[im 1/45]
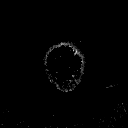
[im 15/45]
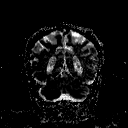
[im 30/45]
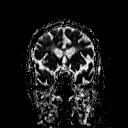
[im 45/45]
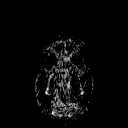

[Series 7: T2 · axial · 5.0mm · 0.60mm/px · z∈[-58,+104]mm · 2 of 27 slices shown (1 of 3)]
[im 1/27]
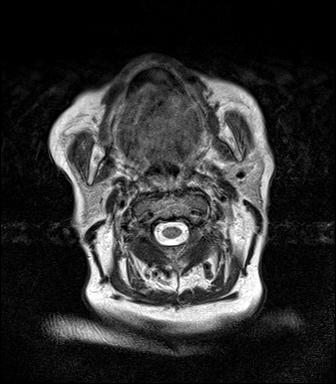
[im 27/27]
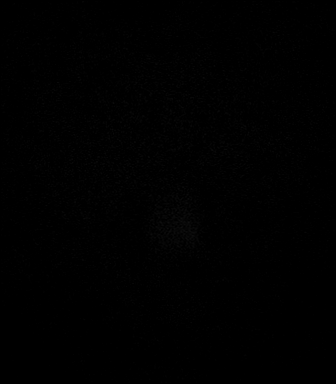

[Series 8: TOF · axial · non-contrast · 0.7mm · 0.37mm/px · z∈[-54,+41]mm · 8 of 143 slices shown]
[im 1/143]
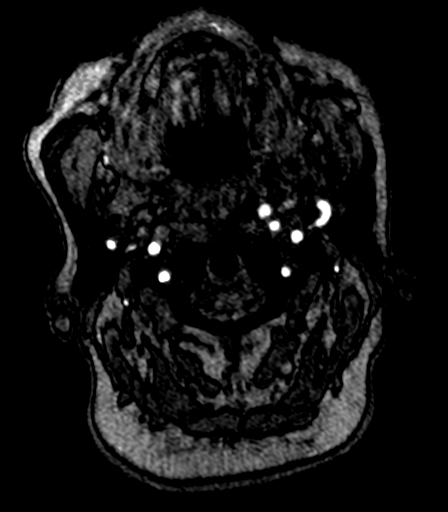
[im 24/143]
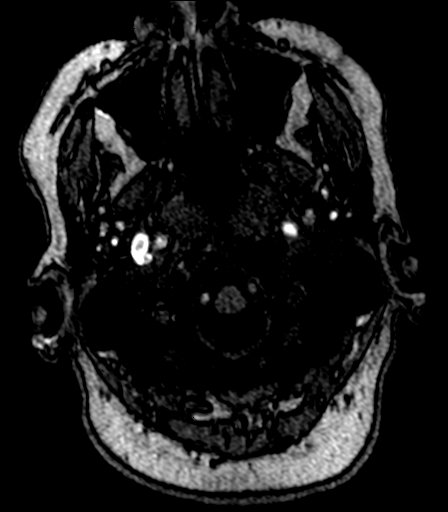
[im 48/143]
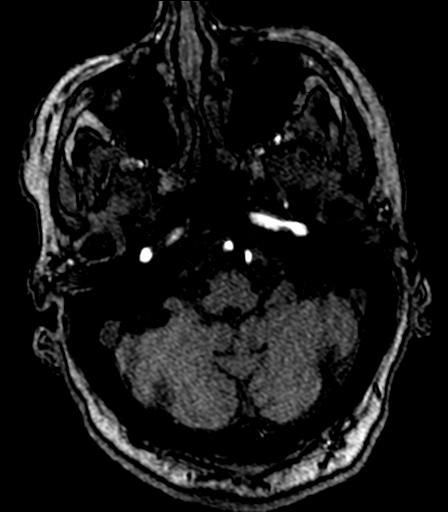
[im 60/143]
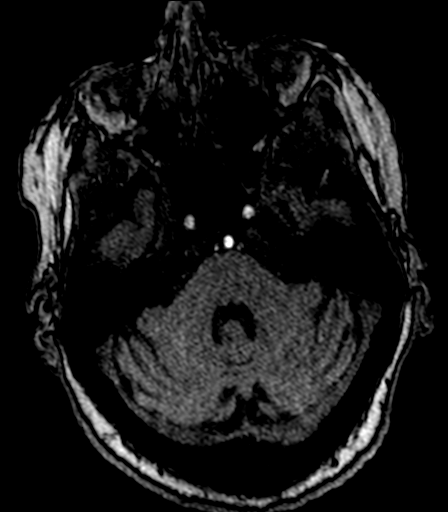
[im 83/143]
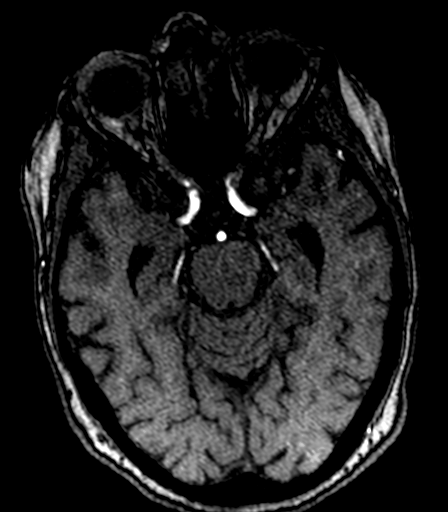
[im 95/143]
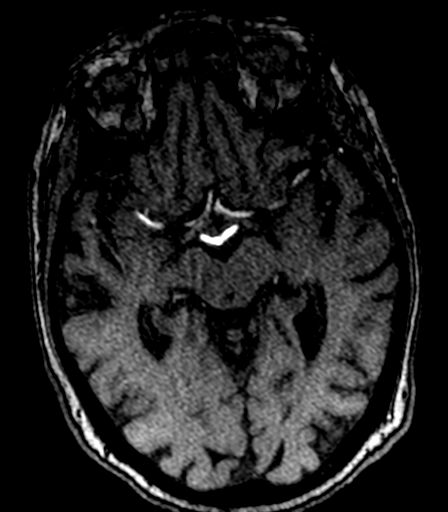
[im 119/143]
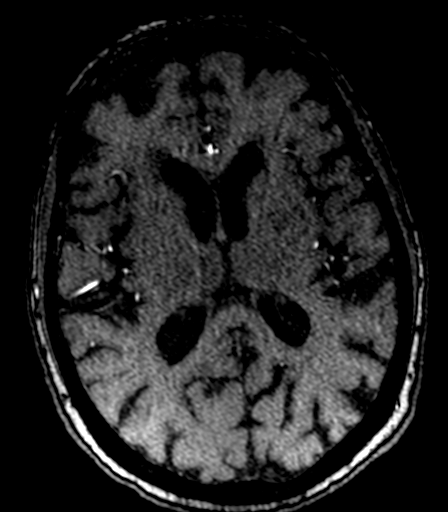
[im 143/143]
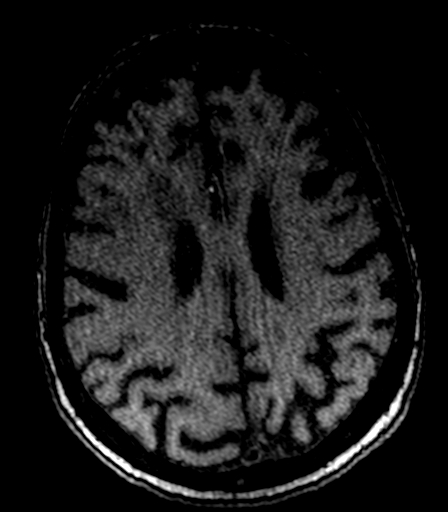

[Series 12: FLAIR · axial · 5.0mm · 0.45mm/px · z∈[-58,+104]mm · 2 of 27 slices shown]
[im 1/27]
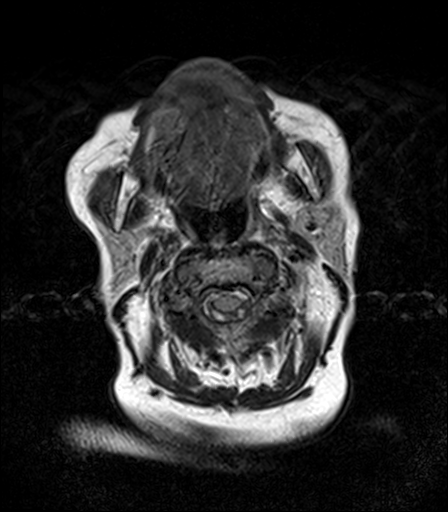
[im 27/27]
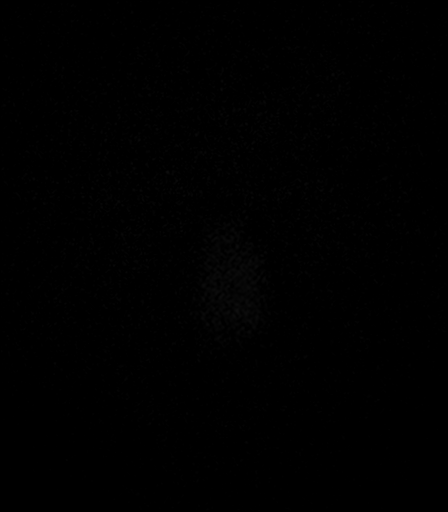

[Series 14: T2 · axial · 5.0mm · 0.45mm/px · z∈[-58,+104]mm · 2 of 27 slices shown (2 of 3)]
[im 1/27]
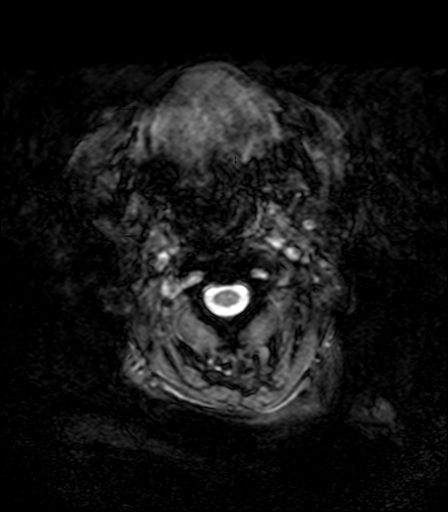
[im 27/27]
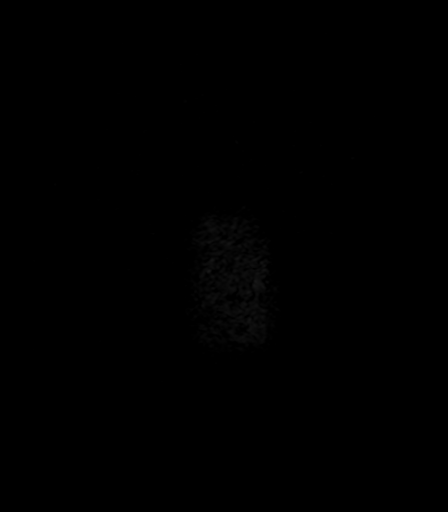

[Series 15: T1 · axial · 3.0mm · 1.00mm/px · z∈[-58,+111]mm · 5 of 60 slices shown (2 of 2)]
[im 1/60]
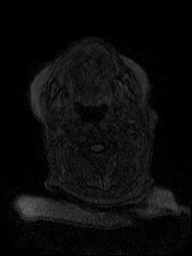
[im 15/60]
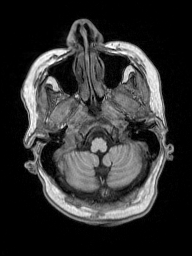
[im 30/60]
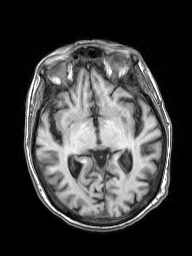
[im 45/60]
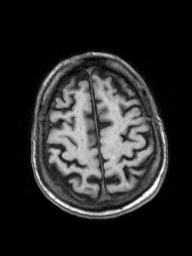
[im 60/60]
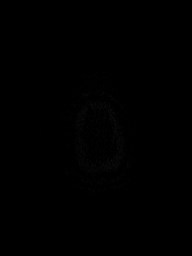

[Series 16: T2 · coronal · 5.0mm · 0.49mm/px · 2 of 27 slices shown (3 of 3)]
[im 1/27]
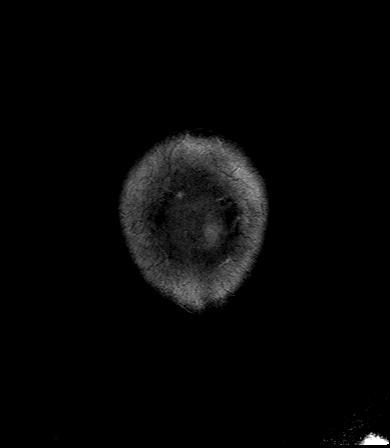
[im 27/27]
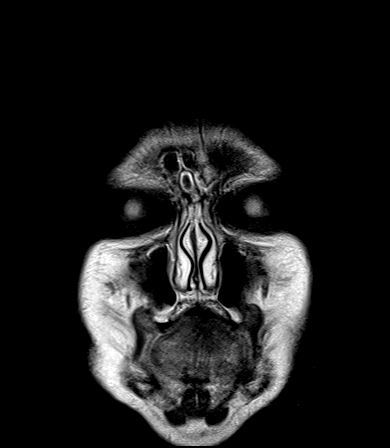

[Series 103: anterior s to · axial · 0.37mm/px · 1 of 2 slices shown]
[im 1/2]
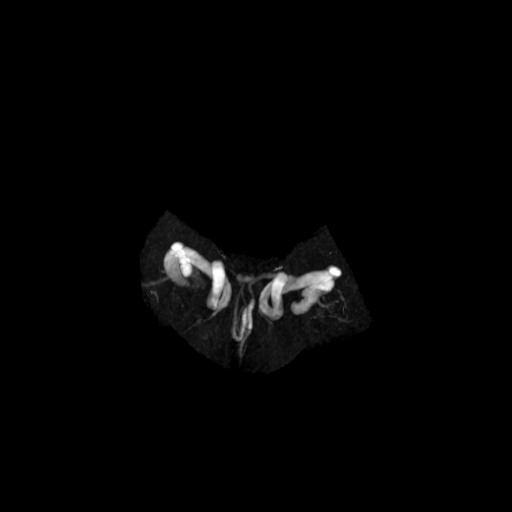

[33 of 48 positions shown; findings below may reference images not displayed]

FINDINGS: MRI HEAD FINDINGS

Brain: Confluent 3 cm area of restricted diffusion involving much of
the left basal ganglia, and a portion of the medial left corona
radiata. This corresponds to the recent CT finding. Associated T2
and FLAIR hyperintensity. Evidence of associated petechial
hemorrhage on series 14, image 16, but no mass effect.

No other restricted diffusion. Since 1559 progressed bilateral
mostly periventricular cerebral white matter T2 and FLAIR
hyperintensity, now patchy and confluent in areas. No cortical
encephalomalacia identified. No other cerebral blood products
identified. The right basal ganglia, thalami, brainstem, and
cerebellum remain normal.

No midline shift, mass effect, evidence of mass lesion,
ventriculomegaly, extra-axial collection. Cervicomedullary junction
and pituitary are within normal limits.

Vascular: Major intracranial vascular flow voids are stable since
1559.

Skull and upper cervical spine: Negative. Visualized bone marrow
signal is within normal limits.

Sinuses/Orbits: Stable and negative orbits soft tissues. Mild to
moderate paranasal sinus mucosal thickening has progressed, maximal
in the anterior ethmoids and frontal sinuses.

Other: Trace right mastoid effusion. Trace retained secretions in
the nasopharynx. Negative scalp soft tissues.

MRA HEAD FINDINGS

Antegrade flow in the posterior circulation with fairly codominant
distal vertebral arteries. Artifactual signal loss in the proximal
V4 segments. No distal vertebral stenosis. Negative basilar artery.
SCA and PCA origins are normal. Posterior communicating arteries are
diminutive or absent. Normal PCA branches.

Antegrade flow in both ICA siphons. Tortuous distal cervical ICAs.
No siphon stenosis. Normal ophthalmic artery origins. Patent carotid
termini. Normal MCA and ACA origins. Left A1 segment appears
dominant. Anterior communicating artery and visualized ACA branches
are within normal limits. Right MCA M1 segment, bifurcation, and
visible right MCA branches are within normal limits.

Left MCA M1 segment is within normal limits. Left MCA bifurcation is
patent. No left MCA branch occlusion identified.
IMPRESSION: 1. Confluent acute infarct in the left basal ganglia corresponding
to the recent CT finding. Petechial hemorrhage but no associated
mass effect.
2. No other acute intracranial abnormality. Mild for age nonspecific
cerebral white matter changes with progression since [DATE].  Negative intracranial MRA.

## 2018-10-19 IMAGING — US US CAROTID DUPLEX BILAT
1 series · 13 of 24 positions shown · non-contrast
Comparison: MRI 05/16/2016 .

CLINICAL DATA: CVA.

EXAM:
BILATERAL CAROTID DUPLEX ULTRASOUND
TECHNIQUE: Gray scale imaging, color Doppler and duplex ultrasound were
performed of bilateral carotid and vertebral arteries in the neck.

[Series 1: us carotid duplex bilat · 13 of 62 slices shown]
[im 1/62]
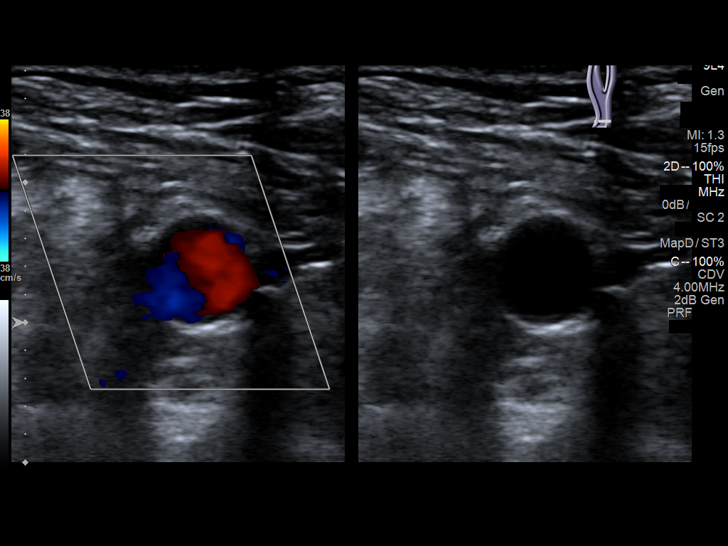
[im 6/62]
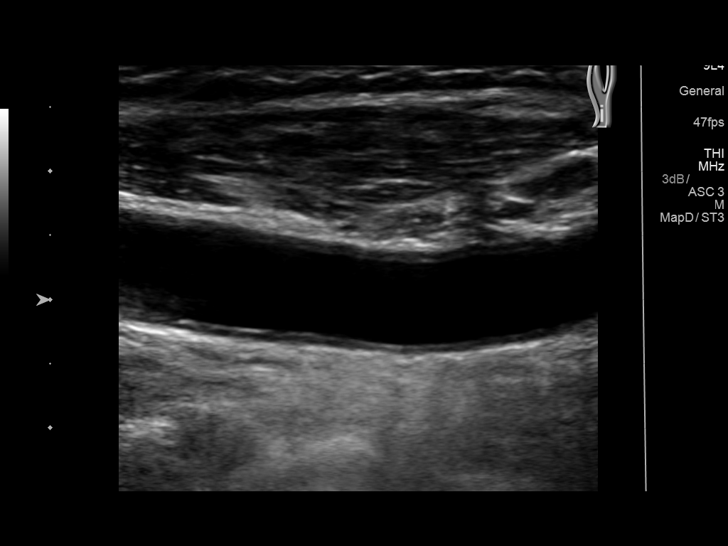
[im 11/62]
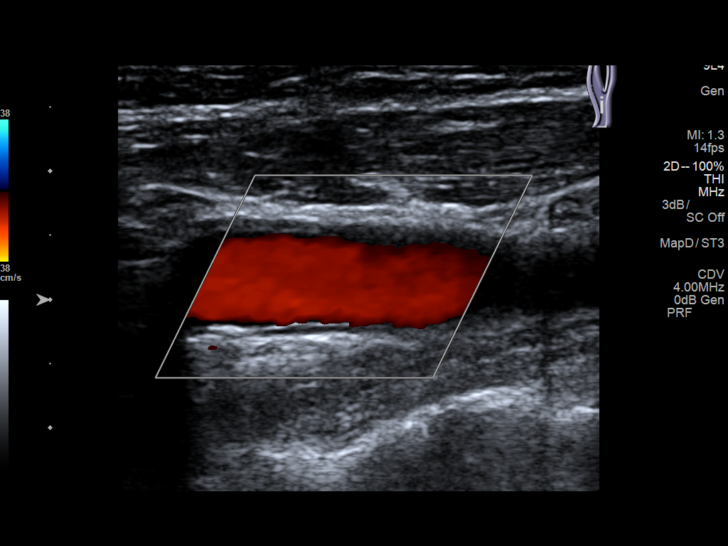
[im 16/62]
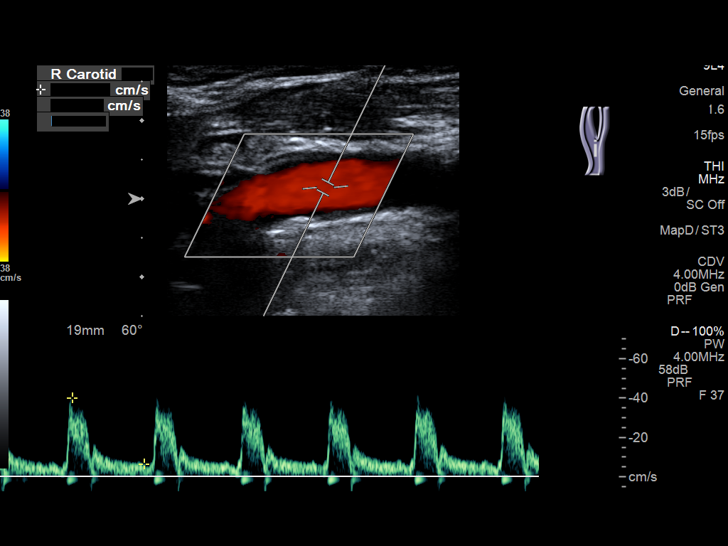
[im 22/62]
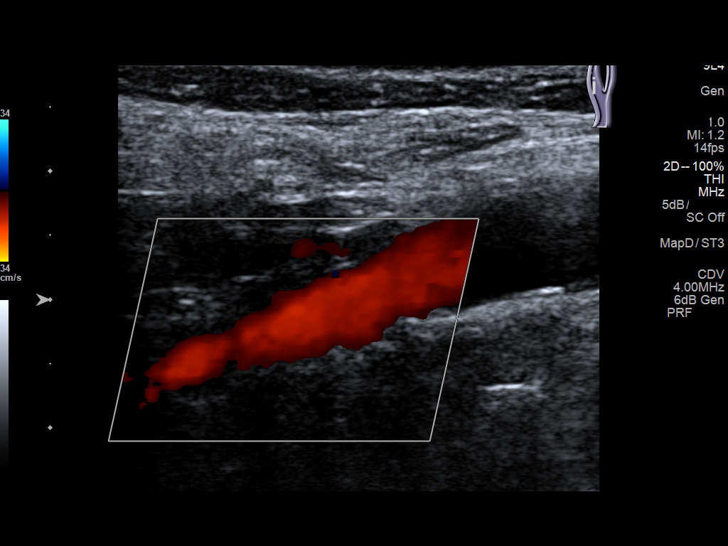
[im 27/62]
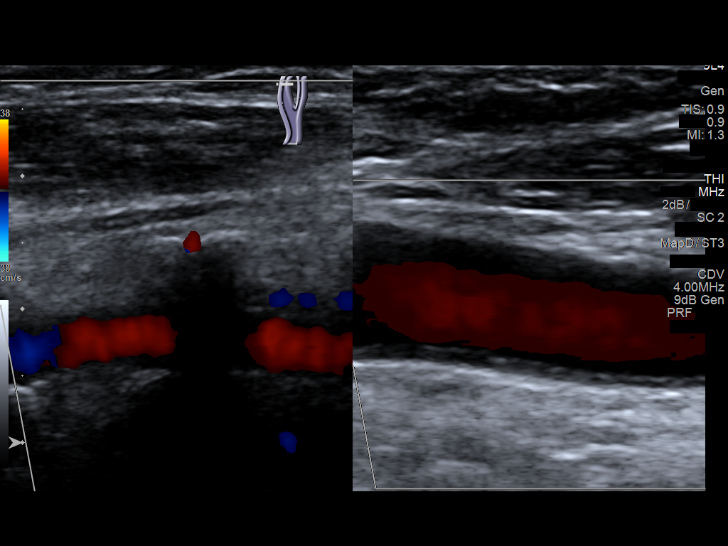
[im 32/62]
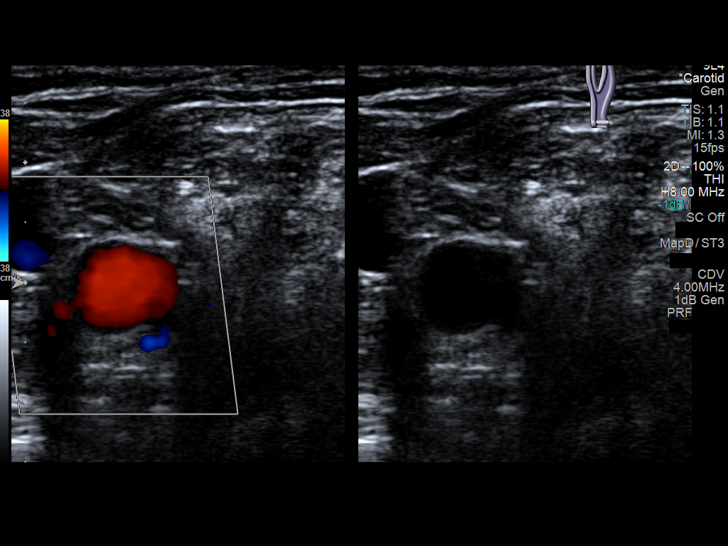
[im 35/62]
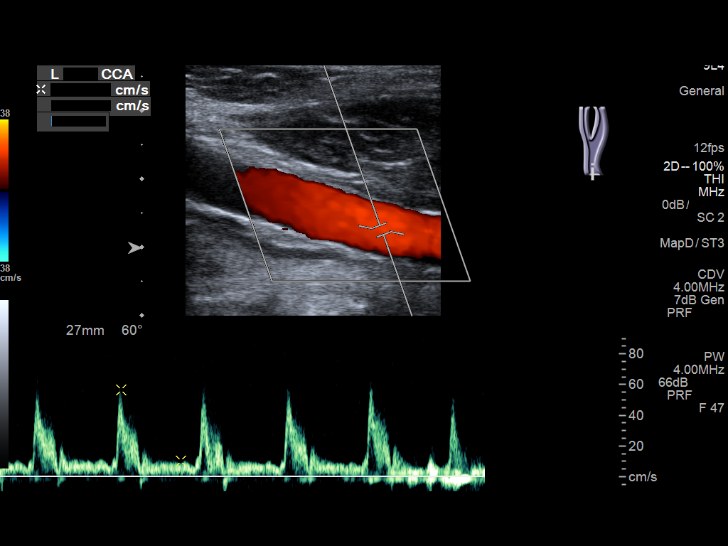
[im 40/62]
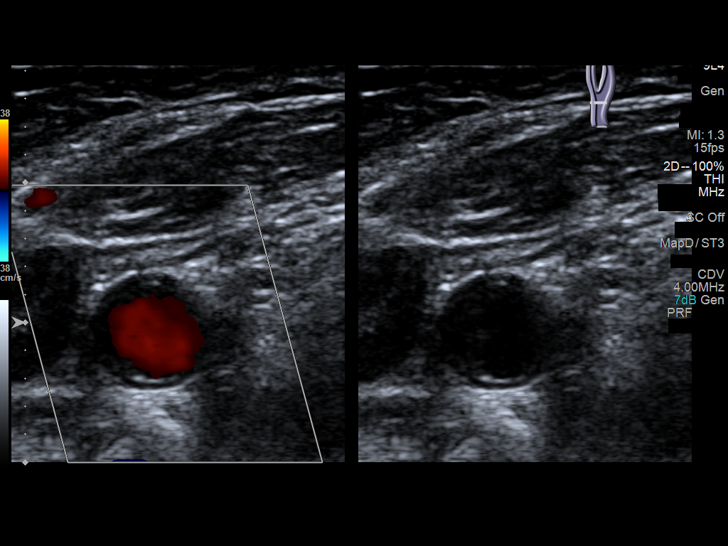
[im 46/62]
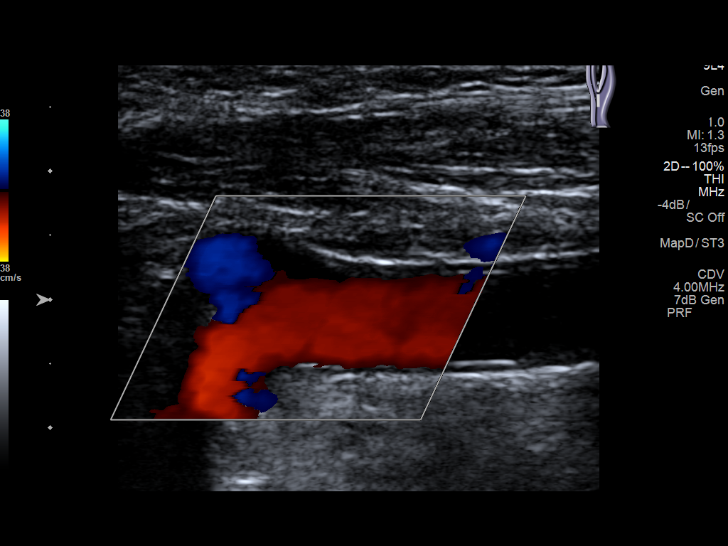
[im 51/62]
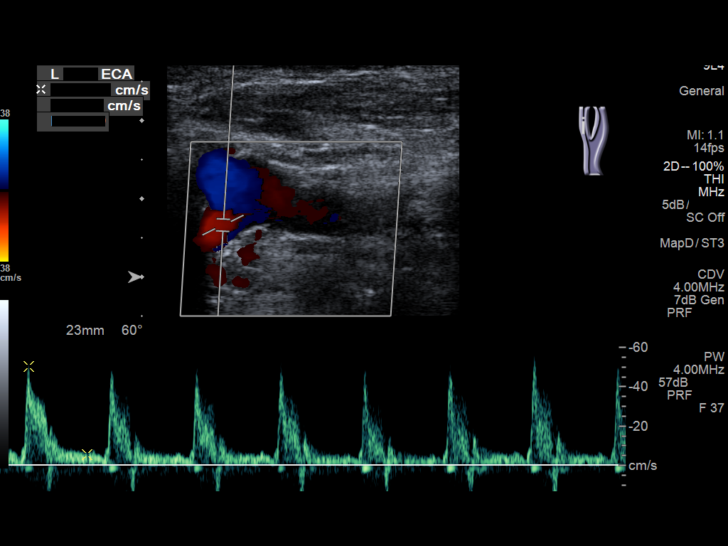
[im 56/62]
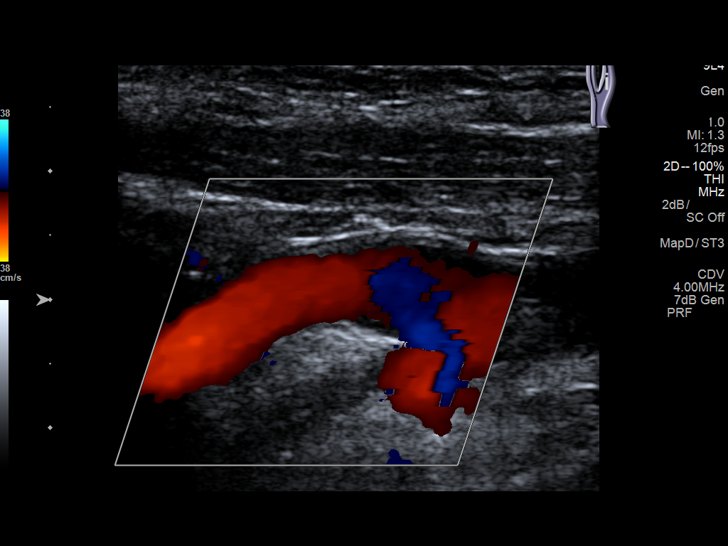
[im 62/62]
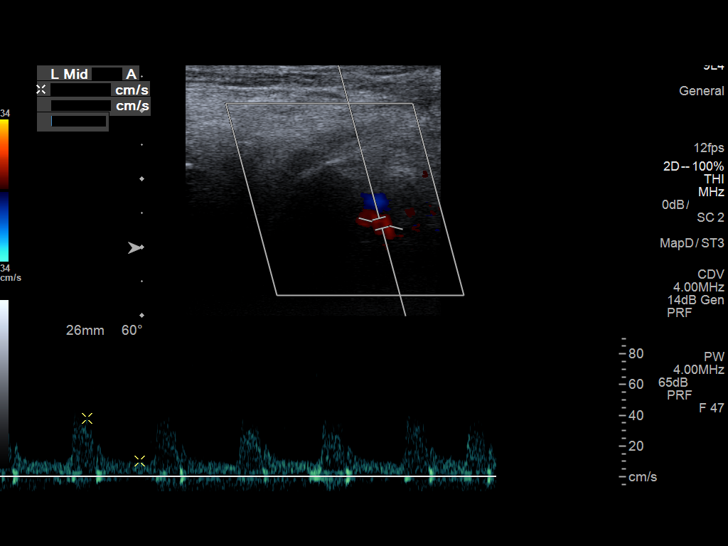

[13 of 24 positions shown; findings below may reference images not displayed]

FINDINGS: Criteria: Quantification of carotid stenosis is based on velocity
parameters that correlate the residual internal carotid diameter
with NASCET-based stenosis levels, using the diameter of the distal
internal carotid lumen as the denominator for stenosis measurement.

The following velocity measurements were obtained:

RIGHT

ICA:  51/16 cm/sec

CCA:  53/6 cm/sec

SYSTOLIC ICA/CCA RATIO:

DIASTOLIC ICA/CCA RATIO:

ECA:  63 cm/sec

LEFT

ICA:  46/7 cm/sec

CCA:  53/8 cm/sec

SYSTOLIC ICA/CCA RATIO:

DIASTOLIC ICA/CCA RATIO:

ECA:  50 cm/sec

RIGHT CAROTID ARTERY: Mild right carotid bifurcation punctate
plaque. No flow limiting stenosis.

RIGHT VERTEBRAL ARTERY:  Patent with antegrade flow.

LEFT CAROTID ARTERY: No significant carotid atherosclerotic vascular
disease.

LEFT VERTEBRAL ARTERY:  Patent with antegrade flow.
IMPRESSION: 1. Mild right carotid bifurcation punctate plaque. No flow limiting
stenosis. Degree of stenosis less than 50%.

2.  No significant left carotid atherosclerotic-disease.

3. Vertebral arteries are patent antegrade flow.
